# Patient Record
Sex: Female | Born: 1960 | Race: White | Hispanic: No | Marital: Single | Smoking: Never smoker
Health system: Southern US, Community
[De-identification: ages and names within clinical notes are randomized; demographics above are authoritative.]

## PROBLEM LIST (undated history)

## (undated) DIAGNOSIS — K219 Gastro-esophageal reflux disease without esophagitis: Secondary | ICD-10-CM

## (undated) DIAGNOSIS — I10 Essential (primary) hypertension: Secondary | ICD-10-CM

## (undated) DIAGNOSIS — R627 Adult failure to thrive: Secondary | ICD-10-CM

## (undated) DIAGNOSIS — M069 Rheumatoid arthritis, unspecified: Secondary | ICD-10-CM

## (undated) DIAGNOSIS — F79 Unspecified intellectual disabilities: Secondary | ICD-10-CM

## (undated) DIAGNOSIS — E7800 Pure hypercholesterolemia, unspecified: Secondary | ICD-10-CM

## (undated) DIAGNOSIS — E039 Hypothyroidism, unspecified: Secondary | ICD-10-CM

## (undated) DIAGNOSIS — R0902 Hypoxemia: Secondary | ICD-10-CM

## (undated) DIAGNOSIS — F329 Major depressive disorder, single episode, unspecified: Secondary | ICD-10-CM

## (undated) DIAGNOSIS — F32A Depression, unspecified: Secondary | ICD-10-CM

## (undated) DIAGNOSIS — E119 Type 2 diabetes mellitus without complications: Secondary | ICD-10-CM

## (undated) DIAGNOSIS — I509 Heart failure, unspecified: Secondary | ICD-10-CM

## (undated) DIAGNOSIS — E612 Magnesium deficiency: Secondary | ICD-10-CM

## (undated) HISTORY — PX: ABDOMINAL HYSTERECTOMY: SHX81

---

## 2002-03-22 ENCOUNTER — Emergency Department (HOSPITAL_COMMUNITY): Admission: EM | Admit: 2002-03-22 | Discharge: 2002-03-22 | Payer: Self-pay | Admitting: *Deleted

## 2004-03-16 ENCOUNTER — Ambulatory Visit (HOSPITAL_COMMUNITY): Admission: RE | Admit: 2004-03-16 | Discharge: 2004-03-16 | Payer: Self-pay | Admitting: Internal Medicine

## 2008-12-12 ENCOUNTER — Emergency Department (HOSPITAL_BASED_OUTPATIENT_CLINIC_OR_DEPARTMENT_OTHER): Admission: EM | Admit: 2008-12-12 | Discharge: 2008-12-12 | Payer: Self-pay | Admitting: Emergency Medicine

## 2008-12-12 ENCOUNTER — Ambulatory Visit: Payer: Self-pay | Admitting: Diagnostic Radiology

## 2010-02-01 ENCOUNTER — Ambulatory Visit: Payer: Self-pay | Admitting: Diagnostic Radiology

## 2010-02-01 ENCOUNTER — Emergency Department (HOSPITAL_BASED_OUTPATIENT_CLINIC_OR_DEPARTMENT_OTHER): Admission: EM | Admit: 2010-02-01 | Discharge: 2010-02-01 | Payer: Self-pay | Admitting: Emergency Medicine

## 2011-02-01 LAB — POCT CARDIAC MARKERS
CKMB, poc: 2.9 ng/mL (ref 1.0–8.0)
Myoglobin, poc: 101 ng/mL (ref 12–200)

## 2011-02-01 LAB — CBC
HCT: 42.3 % (ref 36.0–46.0)
MCV: 89.7 fL (ref 78.0–100.0)
RBC: 4.72 MIL/uL (ref 3.87–5.11)
RDW: 14.3 % (ref 11.5–15.5)
WBC: 9.3 10*3/uL (ref 4.0–10.5)

## 2011-02-01 LAB — CK: Total CK: 57 U/L (ref 7–177)

## 2011-02-01 LAB — COMPREHENSIVE METABOLIC PANEL
ALT: 32 U/L (ref 0–35)
Alkaline Phosphatase: 78 U/L (ref 39–117)
BUN: 42 mg/dL — ABNORMAL HIGH (ref 6–23)
Creatinine, Ser: 0.8 mg/dL (ref 0.4–1.2)
Glucose, Bld: 210 mg/dL — ABNORMAL HIGH (ref 70–99)
Potassium: 4.2 mEq/L (ref 3.5–5.1)
Total Protein: 6.8 g/dL (ref 6.0–8.3)

## 2011-02-01 LAB — DIFFERENTIAL
Basophils Absolute: 0 10*3/uL (ref 0.0–0.1)
Basophils Relative: 0 % (ref 0–1)
Eosinophils Relative: 0 % (ref 0–5)
Neutrophils Relative %: 87 % — ABNORMAL HIGH (ref 43–77)

## 2011-02-01 LAB — URINALYSIS, ROUTINE W REFLEX MICROSCOPIC
Glucose, UA: 500 mg/dL — AB
Hgb urine dipstick: NEGATIVE
Ketones, ur: NEGATIVE mg/dL
Nitrite: NEGATIVE
Specific Gravity, Urine: 1.018 (ref 1.005–1.030)

## 2013-06-17 DEATH — deceased

## 2013-10-29 ENCOUNTER — Emergency Department: Payer: Self-pay | Admitting: Emergency Medicine

## 2013-10-29 LAB — CBC WITH DIFFERENTIAL/PLATELET
BASOS PCT: 0.2 %
Basophil #: 0 10*3/uL (ref 0.0–0.1)
EOS ABS: 0 10*3/uL (ref 0.0–0.7)
Eosinophil %: 0.1 %
HCT: 46 % (ref 35.0–47.0)
HGB: 15.1 g/dL (ref 12.0–16.0)
LYMPHS ABS: 0.7 10*3/uL — AB (ref 1.0–3.6)
Lymphocyte %: 4.2 %
MCH: 30 pg (ref 26.0–34.0)
MCHC: 32.9 g/dL (ref 32.0–36.0)
MCV: 91 fL (ref 80–100)
MONO ABS: 0.4 x10 3/mm (ref 0.2–0.9)
MONOS PCT: 2.6 %
NEUTROS PCT: 92.9 %
Neutrophil #: 15.3 10*3/uL — ABNORMAL HIGH (ref 1.4–6.5)
PLATELETS: 251 10*3/uL (ref 150–440)
RBC: 5.04 10*6/uL (ref 3.80–5.20)
RDW: 15.3 % — AB (ref 11.5–14.5)
WBC: 16.5 10*3/uL — AB (ref 3.6–11.0)

## 2013-10-29 LAB — URINALYSIS, COMPLETE
BILIRUBIN, UR: NEGATIVE
BLOOD: NEGATIVE
GLUCOSE, UR: NEGATIVE mg/dL (ref 0–75)
LEUKOCYTE ESTERASE: NEGATIVE
NITRITE: NEGATIVE
Ph: 6 (ref 4.5–8.0)
Protein: NEGATIVE
RBC,UR: 1 /HPF (ref 0–5)
Specific Gravity: 1.018 (ref 1.003–1.030)

## 2013-10-29 LAB — LIPASE, BLOOD: LIPASE: 95 U/L (ref 73–393)

## 2013-10-29 LAB — COMPREHENSIVE METABOLIC PANEL
ALBUMIN: 3.9 g/dL (ref 3.4–5.0)
ALK PHOS: 77 U/L
AST: 113 U/L — AB (ref 15–37)
Anion Gap: 5 — ABNORMAL LOW (ref 7–16)
BUN: 19 mg/dL — AB (ref 7–18)
Bilirubin,Total: 0.6 mg/dL (ref 0.2–1.0)
Calcium, Total: 9.8 mg/dL (ref 8.5–10.1)
Chloride: 101 mmol/L (ref 98–107)
Co2: 30 mmol/L (ref 21–32)
Creatinine: 0.57 mg/dL — ABNORMAL LOW (ref 0.60–1.30)
EGFR (African American): >60
EGFR (Non-African Amer.): >60
GLUCOSE: 169 mg/dL — AB (ref 65–99)
Osmolality: 278 (ref 275–301)
POTASSIUM: 4 mmol/L (ref 3.5–5.1)
SGPT (ALT): 124 U/L — ABNORMAL HIGH (ref 12–78)
Sodium: 136 mmol/L (ref 136–145)
Total Protein: 7.9 g/dL (ref 6.4–8.2)

## 2013-10-29 LAB — TROPONIN I

## 2013-10-31 ENCOUNTER — Emergency Department: Payer: Self-pay | Admitting: Emergency Medicine

## 2013-10-31 LAB — URINALYSIS, COMPLETE
BILIRUBIN, UR: NEGATIVE
GLUCOSE, UR: NEGATIVE mg/dL (ref 0–75)
Nitrite: POSITIVE
PH: 5 (ref 4.5–8.0)
Protein: 30
SPECIFIC GRAVITY: 1.021 (ref 1.003–1.030)
Squamous Epithelial: 2
WBC UR: 41 /HPF (ref 0–5)

## 2013-10-31 LAB — BASIC METABOLIC PANEL
Anion Gap: 4 — ABNORMAL LOW (ref 7–16)
BUN: 19 mg/dL — ABNORMAL HIGH (ref 7–18)
CO2: 28 mmol/L (ref 21–32)
CREATININE: 0.61 mg/dL (ref 0.60–1.30)
Calcium, Total: 9.5 mg/dL (ref 8.5–10.1)
Chloride: 103 mmol/L (ref 98–107)
GLUCOSE: 141 mg/dL — AB (ref 65–99)
OSMOLALITY: 275 (ref 275–301)
Potassium: 3.6 mmol/L (ref 3.5–5.1)
SODIUM: 135 mmol/L — AB (ref 136–145)

## 2013-10-31 LAB — CBC
HCT: 43.9 % (ref 35.0–47.0)
HGB: 14.5 g/dL (ref 12.0–16.0)
MCH: 30.2 pg (ref 26.0–34.0)
MCHC: 33 g/dL (ref 32.0–36.0)
MCV: 92 fL (ref 80–100)
PLATELETS: 226 10*3/uL (ref 150–440)
RBC: 4.8 10*6/uL (ref 3.80–5.20)
RDW: 15.3 % — AB (ref 11.5–14.5)
WBC: 13.3 10*3/uL — ABNORMAL HIGH (ref 3.6–11.0)

## 2013-11-05 LAB — BASIC METABOLIC PANEL
Anion Gap: 11 (ref 7–16)
BUN: 19 mg/dL — AB (ref 7–18)
CO2: 29 mmol/L (ref 21–32)
CREATININE: 0.4 mg/dL — AB (ref 0.60–1.30)
Calcium, Total: 8.7 mg/dL (ref 8.5–10.1)
Chloride: 102 mmol/L (ref 98–107)
EGFR (Non-African Amer.): >60
Glucose: 94 mg/dL (ref 65–99)
Osmolality: 285 (ref 275–301)
POTASSIUM: 3.3 mmol/L — AB (ref 3.5–5.1)
SODIUM: 142 mmol/L (ref 136–145)

## 2013-11-05 LAB — URINALYSIS, COMPLETE
BILIRUBIN, UR: NEGATIVE
Bacteria: NONE SEEN
Glucose,UR: NEGATIVE mg/dL (ref 0–75)
LEUKOCYTE ESTERASE: NEGATIVE
Nitrite: NEGATIVE
PH: 5 (ref 4.5–8.0)
Protein: NEGATIVE
RBC,UR: 6 /HPF (ref 0–5)
Specific Gravity: 1.015 (ref 1.003–1.030)
WBC UR: 2 /HPF (ref 0–5)

## 2013-11-05 LAB — CBC WITH DIFFERENTIAL/PLATELET
BASOS ABS: 0.1 10*3/uL (ref 0.0–0.1)
BASOS PCT: 0.6 %
EOS ABS: 0.2 10*3/uL (ref 0.0–0.7)
Eosinophil %: 2.3 %
HCT: 47.6 % — ABNORMAL HIGH (ref 35.0–47.0)
HGB: 15.7 g/dL (ref 12.0–16.0)
Lymphocyte #: 1.3 10*3/uL (ref 1.0–3.6)
Lymphocyte %: 14.3 %
MCH: 30.5 pg (ref 26.0–34.0)
MCHC: 32.9 g/dL (ref 32.0–36.0)
MCV: 93 fL (ref 80–100)
MONOS PCT: 11.2 %
Monocyte #: 1 x10 3/mm — ABNORMAL HIGH (ref 0.2–0.9)
NEUTROS ABS: 6.5 10*3/uL (ref 1.4–6.5)
Neutrophil %: 71.6 %
Platelet: 255 10*3/uL (ref 150–440)
RBC: 5.14 10*6/uL (ref 3.80–5.20)
RDW: 15.5 % — AB (ref 11.5–14.5)
WBC: 9.1 10*3/uL (ref 3.6–11.0)

## 2013-11-05 LAB — TSH: THYROID STIMULATING HORM: 2.17 u[IU]/mL

## 2013-11-05 LAB — CK: CK, TOTAL: 65 U/L (ref 21–215)

## 2013-11-06 LAB — BASIC METABOLIC PANEL
ANION GAP: 6 — AB (ref 7–16)
BUN: 23 mg/dL — AB (ref 7–18)
CALCIUM: 9 mg/dL (ref 8.5–10.1)
Chloride: 104 mmol/L (ref 98–107)
Co2: 29 mmol/L (ref 21–32)
Creatinine: 0.46 mg/dL — ABNORMAL LOW (ref 0.60–1.30)
EGFR (African American): >60
EGFR (Non-African Amer.): >60
Glucose: 93 mg/dL (ref 65–99)
OSMOLALITY: 281 (ref 275–301)
Potassium: 3.5 mmol/L (ref 3.5–5.1)
Sodium: 139 mmol/L (ref 136–145)

## 2013-11-07 LAB — BASIC METABOLIC PANEL
ANION GAP: 5 — AB (ref 7–16)
BUN: 20 mg/dL — ABNORMAL HIGH (ref 7–18)
CHLORIDE: 109 mmol/L — AB (ref 98–107)
Calcium, Total: 8.7 mg/dL (ref 8.5–10.1)
Co2: 30 mmol/L (ref 21–32)
Creatinine: 0.57 mg/dL — ABNORMAL LOW (ref 0.60–1.30)
Glucose: 118 mg/dL — ABNORMAL HIGH (ref 65–99)
OSMOLALITY: 291 (ref 275–301)
Potassium: 4.1 mmol/L (ref 3.5–5.1)
SODIUM: 144 mmol/L (ref 136–145)

## 2013-11-08 ENCOUNTER — Inpatient Hospital Stay: Payer: Self-pay | Admitting: Internal Medicine

## 2013-11-08 LAB — URINALYSIS, COMPLETE
Bilirubin,UR: NEGATIVE
Blood: NEGATIVE
Glucose,UR: NEGATIVE mg/dL (ref 0–75)
Hyaline Cast: 1
Ketone: NEGATIVE
Leukocyte Esterase: NEGATIVE
Nitrite: NEGATIVE
Ph: 5 (ref 4.5–8.0)
Protein: NEGATIVE
RBC,UR: 1 /HPF (ref 0–5)
Specific Gravity: 1.017 (ref 1.003–1.030)
Squamous Epithelial: 3
WBC UR: 6 /HPF (ref 0–5)

## 2013-11-08 LAB — CBC WITH DIFFERENTIAL/PLATELET
Basophil #: 0 10*3/uL (ref 0.0–0.1)
Basophil %: 1.2 %
Eosinophil #: 0.3 10*3/uL (ref 0.0–0.7)
Eosinophil %: 9.6 %
HCT: 40.7 % (ref 35.0–47.0)
HGB: 13.1 g/dL (ref 12.0–16.0)
Lymphocyte #: 1.2 10*3/uL (ref 1.0–3.6)
Lymphocyte %: 33.7 %
MCH: 30.4 pg (ref 26.0–34.0)
MCHC: 32.3 g/dL (ref 32.0–36.0)
MCV: 94 fL (ref 80–100)
Monocyte #: 0.7 x10 3/mm (ref 0.2–0.9)
Monocyte %: 20.5 %
Neutrophil #: 1.2 10*3/uL — ABNORMAL LOW (ref 1.4–6.5)
Neutrophil %: 35 %
Platelet: 219 10*3/uL (ref 150–440)
RBC: 4.32 10*6/uL (ref 3.80–5.20)
RDW: 15.9 % — ABNORMAL HIGH (ref 11.5–14.5)
WBC: 3.5 10*3/uL — ABNORMAL LOW (ref 3.6–11.0)

## 2013-11-08 LAB — WBCS, STOOL

## 2013-11-08 LAB — BASIC METABOLIC PANEL
Anion Gap: 0 — ABNORMAL LOW (ref 7–16)
BUN: 16 mg/dL (ref 7–18)
CALCIUM: 8.7 mg/dL (ref 8.5–10.1)
CO2: 37 mmol/L — AB (ref 21–32)
CREATININE: 0.59 mg/dL — AB (ref 0.60–1.30)
Chloride: 105 mmol/L (ref 98–107)
EGFR (Non-African Amer.): >60
Glucose: 95 mg/dL (ref 65–99)
OSMOLALITY: 284 (ref 275–301)
Potassium: 4 mmol/L (ref 3.5–5.1)
SODIUM: 142 mmol/L (ref 136–145)

## 2013-11-10 LAB — CBC WITH DIFFERENTIAL/PLATELET
Basophil #: 0 10*3/uL (ref 0.0–0.1)
Basophil %: 1 %
EOS PCT: 6.3 %
Eosinophil #: 0.3 10*3/uL (ref 0.0–0.7)
HCT: 41.7 % (ref 35.0–47.0)
HGB: 13.6 g/dL (ref 12.0–16.0)
Lymphocyte #: 1.5 10*3/uL (ref 1.0–3.6)
Lymphocyte %: 34 %
MCH: 30.3 pg (ref 26.0–34.0)
MCHC: 32.5 g/dL (ref 32.0–36.0)
MCV: 93 fL (ref 80–100)
MONO ABS: 0.9 x10 3/mm (ref 0.2–0.9)
MONOS PCT: 19.8 %
Neutrophil #: 1.7 10*3/uL (ref 1.4–6.5)
Neutrophil %: 38.9 %
PLATELETS: 246 10*3/uL (ref 150–440)
RBC: 4.47 10*6/uL (ref 3.80–5.20)
RDW: 15.3 % — AB (ref 11.5–14.5)
WBC: 4.5 10*3/uL (ref 3.6–11.0)

## 2013-11-10 LAB — BASIC METABOLIC PANEL
ANION GAP: 1 — AB (ref 7–16)
BUN: 17 mg/dL (ref 7–18)
CHLORIDE: 96 mmol/L — AB (ref 98–107)
CO2: 37 mmol/L — AB (ref 21–32)
CREATININE: 0.61 mg/dL (ref 0.60–1.30)
Calcium, Total: 9 mg/dL (ref 8.5–10.1)
GLUCOSE: 112 mg/dL — AB (ref 65–99)
Osmolality: 271 (ref 275–301)
Potassium: 4.1 mmol/L (ref 3.5–5.1)
Sodium: 134 mmol/L — ABNORMAL LOW (ref 136–145)

## 2013-11-16 LAB — COMPREHENSIVE METABOLIC PANEL
Albumin: 3.4 g/dL (ref 3.4–5.0)
Alkaline Phosphatase: 109 U/L
Anion Gap: 6 — ABNORMAL LOW (ref 7–16)
BUN: 26 mg/dL — ABNORMAL HIGH (ref 7–18)
Bilirubin,Total: 0.8 mg/dL (ref 0.2–1.0)
Calcium, Total: 9.5 mg/dL (ref 8.5–10.1)
Chloride: 95 mmol/L — ABNORMAL LOW (ref 98–107)
Co2: 32 mmol/L (ref 21–32)
Creatinine: 0.55 mg/dL — ABNORMAL LOW (ref 0.60–1.30)
EGFR (African American): >60
EGFR (Non-African Amer.): >60
Glucose: 171 mg/dL — ABNORMAL HIGH (ref 65–99)
Osmolality: 275 (ref 275–301)
Potassium: 3.8 mmol/L (ref 3.5–5.1)
SGOT(AST): 37 U/L (ref 15–37)
SGPT (ALT): 43 U/L (ref 12–78)
Sodium: 133 mmol/L — ABNORMAL LOW (ref 136–145)
Total Protein: 7.5 g/dL (ref 6.4–8.2)

## 2013-11-16 LAB — CBC WITH DIFFERENTIAL/PLATELET
Basophil #: 0.1 10*3/uL (ref 0.0–0.1)
Basophil %: 0.7 %
Eosinophil #: 0 10*3/uL (ref 0.0–0.7)
Eosinophil %: 0.4 %
HCT: 48.9 % — ABNORMAL HIGH (ref 35.0–47.0)
HGB: 16 g/dL (ref 12.0–16.0)
Lymphocyte #: 1.3 10*3/uL (ref 1.0–3.6)
Lymphocyte %: 13.7 %
MCH: 30.5 pg (ref 26.0–34.0)
MCHC: 32.8 g/dL (ref 32.0–36.0)
MCV: 93 fL (ref 80–100)
Monocyte #: 0.7 x10 3/mm (ref 0.2–0.9)
Monocyte %: 7.2 %
Neutrophil #: 7.2 10*3/uL — ABNORMAL HIGH (ref 1.4–6.5)
Neutrophil %: 78 %
Platelet: 351 10*3/uL (ref 150–440)
RBC: 5.26 10*6/uL — ABNORMAL HIGH (ref 3.80–5.20)
RDW: 15.4 % — ABNORMAL HIGH (ref 11.5–14.5)
WBC: 9.2 10*3/uL (ref 3.6–11.0)

## 2013-11-16 LAB — LIPASE, BLOOD: LIPASE: 128 U/L (ref 73–393)

## 2013-11-16 LAB — AMYLASE: Amylase: 67 U/L (ref 25–115)

## 2013-11-17 LAB — BASIC METABOLIC PANEL
Anion Gap: 6 — ABNORMAL LOW (ref 7–16)
BUN: 34 mg/dL — ABNORMAL HIGH (ref 7–18)
CO2: 30 mmol/L (ref 21–32)
Calcium, Total: 9.3 mg/dL (ref 8.5–10.1)
Chloride: 99 mmol/L (ref 98–107)
Creatinine: 0.54 mg/dL — ABNORMAL LOW (ref 0.60–1.30)
EGFR (African American): >60
GLUCOSE: 151 mg/dL — AB (ref 65–99)
Osmolality: 281 (ref 275–301)
Potassium: 3.6 mmol/L (ref 3.5–5.1)
Sodium: 135 mmol/L — ABNORMAL LOW (ref 136–145)

## 2013-11-17 LAB — CBC WITH DIFFERENTIAL/PLATELET
Basophil #: 0.1 10*3/uL (ref 0.0–0.1)
Basophil %: 0.7 %
EOS PCT: 0.2 %
Eosinophil #: 0 10*3/uL (ref 0.0–0.7)
HCT: 45.5 % (ref 35.0–47.0)
HGB: 15.4 g/dL (ref 12.0–16.0)
LYMPHS PCT: 15 %
Lymphocyte #: 1.3 10*3/uL (ref 1.0–3.6)
MCH: 31 pg (ref 26.0–34.0)
MCHC: 33.8 g/dL (ref 32.0–36.0)
MCV: 92 fL (ref 80–100)
MONO ABS: 0.8 x10 3/mm (ref 0.2–0.9)
MONOS PCT: 10 %
Neutrophil #: 6.2 10*3/uL (ref 1.4–6.5)
Neutrophil %: 74.1 %
Platelet: 336 10*3/uL (ref 150–440)
RBC: 4.96 10*6/uL (ref 3.80–5.20)
RDW: 15.6 % — AB (ref 11.5–14.5)
WBC: 8.4 10*3/uL (ref 3.6–11.0)

## 2013-11-17 LAB — URINALYSIS, COMPLETE
BACTERIA: NONE SEEN
Bilirubin,UR: NEGATIVE
Blood: NEGATIVE
Glucose,UR: NEGATIVE mg/dL (ref 0–75)
Leukocyte Esterase: NEGATIVE
NITRITE: NEGATIVE
Ph: 5 (ref 4.5–8.0)
Protein: NEGATIVE
Specific Gravity: 1.03 (ref 1.003–1.030)
WBC UR: 4 /HPF (ref 0–5)

## 2013-11-18 LAB — BASIC METABOLIC PANEL
Anion Gap: 1 — ABNORMAL LOW (ref 7–16)
BUN: 25 mg/dL — ABNORMAL HIGH (ref 7–18)
Calcium, Total: 8.7 mg/dL (ref 8.5–10.1)
Chloride: 103 mmol/L (ref 98–107)
Co2: 33 mmol/L — ABNORMAL HIGH (ref 21–32)
Creatinine: 0.57 mg/dL — ABNORMAL LOW (ref 0.60–1.30)
EGFR (African American): >60
EGFR (Non-African Amer.): >60
Glucose: 67 mg/dL (ref 65–99)
Osmolality: 276 (ref 275–301)
Potassium: 3.7 mmol/L (ref 3.5–5.1)
Sodium: 137 mmol/L (ref 136–145)

## 2013-11-18 LAB — HEMATOCRIT: HCT: 41.5 % (ref 35.0–47.0)

## 2014-04-06 ENCOUNTER — Other Ambulatory Visit: Payer: Self-pay

## 2014-04-06 LAB — COMPREHENSIVE METABOLIC PANEL
ALBUMIN: 3.2 g/dL — AB (ref 3.4–5.0)
ALT: 34 U/L (ref 12–78)
ANION GAP: 6 — AB (ref 7–16)
Alkaline Phosphatase: 65 U/L
BILIRUBIN TOTAL: 0.5 mg/dL (ref 0.2–1.0)
BUN: 25 mg/dL — ABNORMAL HIGH (ref 7–18)
CALCIUM: 9.3 mg/dL (ref 8.5–10.1)
CO2: 33 mmol/L — AB (ref 21–32)
Chloride: 98 mmol/L (ref 98–107)
Creatinine: 0.56 mg/dL — ABNORMAL LOW (ref 0.60–1.30)
EGFR (African American): >60
GLUCOSE: 88 mg/dL (ref 65–99)
Osmolality: 278 (ref 275–301)
Potassium: 4.1 mmol/L (ref 3.5–5.1)
SGOT(AST): 37 U/L (ref 15–37)
Sodium: 137 mmol/L (ref 136–145)
Total Protein: 6.3 g/dL — ABNORMAL LOW (ref 6.4–8.2)

## 2014-04-06 LAB — CBC WITH DIFFERENTIAL/PLATELET
BASOS ABS: 0 10*3/uL (ref 0.0–0.1)
BASOS PCT: 0.6 %
Eosinophil #: 0.3 10*3/uL (ref 0.0–0.7)
Eosinophil %: 5.9 %
HCT: 42.8 % (ref 35.0–47.0)
HGB: 13.5 g/dL (ref 12.0–16.0)
LYMPHS PCT: 36.1 %
Lymphocyte #: 1.6 10*3/uL (ref 1.0–3.6)
MCH: 30.7 pg (ref 26.0–34.0)
MCHC: 31.6 g/dL — AB (ref 32.0–36.0)
MCV: 97 fL (ref 80–100)
MONOS PCT: 11.8 %
Monocyte #: 0.5 x10 3/mm (ref 0.2–0.9)
Neutrophil #: 2.1 10*3/uL (ref 1.4–6.5)
Neutrophil %: 45.6 %
Platelet: 202 10*3/uL (ref 150–440)
RBC: 4.4 10*6/uL (ref 3.80–5.20)
RDW: 15.5 % — ABNORMAL HIGH (ref 11.5–14.5)
WBC: 4.5 10*3/uL (ref 3.6–11.0)

## 2014-09-30 ENCOUNTER — Ambulatory Visit: Payer: Self-pay

## 2014-09-30 LAB — URINALYSIS, COMPLETE
BILIRUBIN, UR: NEGATIVE
Bacteria: NONE SEEN
Blood: NEGATIVE
GLUCOSE, UR: NEGATIVE mg/dL (ref 0–75)
Hyaline Cast: 2
KETONE: NEGATIVE
Leukocyte Esterase: NEGATIVE
NITRITE: NEGATIVE
Ph: 5 (ref 4.5–8.0)
Protein: NEGATIVE
SPECIFIC GRAVITY: 1.011 (ref 1.003–1.030)

## 2014-10-01 LAB — URINE CULTURE

## 2014-10-13 ENCOUNTER — Ambulatory Visit: Payer: Self-pay

## 2014-10-13 LAB — URINALYSIS, COMPLETE
Bilirubin,UR: NEGATIVE
Blood: NEGATIVE
GLUCOSE, UR: NEGATIVE mg/dL (ref 0–75)
Hyaline Cast: 5
KETONE: NEGATIVE
Nitrite: NEGATIVE
PROTEIN: NEGATIVE
Ph: 5 (ref 4.5–8.0)
RBC, UR: NONE SEEN /HPF (ref 0–5)
SQUAMOUS EPITHELIAL: NONE SEEN
Specific Gravity: 1.009 (ref 1.003–1.030)
WBC UR: 11 /HPF (ref 0–5)

## 2014-10-15 LAB — URINE CULTURE

## 2015-02-07 NOTE — H&P (Signed)
PATIENT NAME:  Meghan Patterson, Meghan Patterson MR#:  [id number] DATE OF BIRTH:  Mar 05, 1961  DATE OF ADMISSION:  11/05/2013  PRIMARY CARE PHYSICIAN: John B. Danne Harbor, MD  CHIEF COMPLAINT: Weakness and diarrhea.   HISTORY OF PRESENT ILLNESS: A 54 year old Caucasian female patient with history of cognitive impairment, polymyositis, who was recently seen here in the Emergency Room on 10/31/2013 after she suffered from falls and had weakness. Had multiple CTs and x-rays, ruled out for fractures or trauma and sent back to home. Returns as her sister, who helps take care of her, is unable to take care of her now and the patient was found covered in feces and urine in her bed, unable to get out. The patient is being admitted for placement in a nursing home. She does look dehydrated on examination. She is unable to tell me clearly how much diarrhea she has had but mentions that she was not taking much fluids, had diarrhea, complains of pain in her back since the fall.   No recent change in medications. I have discussed the case with the social worker in the Emergency Room and also the ER physician. Reviewed all old records.   PAST MEDICAL HISTORY:  1.  Cognitive impairment.  2.  Hypertension.  3.  Obesity.  4.  Arthritis.  5.  Diabetes.  6.  Polymyositis.   SOCIAL HISTORY: The patient normally ambulates with a walker. Lives with her sister, who helps take care of her and with the activities of daily living. Does not smoke. No alcohol. No illicit drugs.   CODE STATUS: Full code.   ALLERGIES: BEE STINGS.   FAMILY HISTORY: Reviewed but the patient is not aware of any medical problems in her mom and dad.   REVIEW OF SYSTEMS:  GENERAL:  No fever. Complains of fatigue and weakness.  EYES: No blurred vision, pain or redness.  ENT: No tinnitus, ear pain, hearing loss.  RESPIRATORY: No cough, wheeze, hemoptysis.  CARDIOVASCULAR: No chest pain, orthopnea, edema.  GASTROINTESTINAL: Has had nausea but no vomiting.  Had diarrhea, no abdominal pain. No bleeding.  GENITOURINARY: No dysuria, hematuria, frequency.  ENDOCRINE: No polyuria, nocturia. Has hypothyroidism.  HEMATOLOGIC AND LYMPHATIC: No anemia, easy bruising, bleeding.  INTEGUMENTARY: No acne, rash.  Has multiple areas of bruising on her body.  MUSCULOSKELETAL: Has back pain, arthritis.  NEUROLOGIC: No focal numbness, weakness, seizures.  PSYCHIATRIC: No anxiety or depression.   HOME MEDICATIONS: Include:  1.  Glyburide 10 mg oral 2 times a day.  2.  Metformin 1000 mg b.i.d.  3.  Losartan 100 mg daily.  4.  Methotrexate 2.5 mg 8 tablets orally once a week.  5.  Synthroid 175 mcg daily.  6.  Zetia 10 mg oral once a day.   PHYSICAL EXAMINATION: VITAL SIGNS: Temperature 97.9, pulse of 90, blood pressure 137/89, saturating 94% on room air. GENERAL:  Morbidly obese Caucasian female patient lying in bed, seems comfortable, conversational.  PSYCHIATRIC: Alert, awake, oriented to person, place and time. Has a flat affect.  HEENT: Atraumatic, normocephalic. Oral mucosa dry and pink. External ears and nose normal. No pallor. No icterus. Pupils bilaterally equal and reactive to light.  NECK: Supple. No thyromegaly or palpable lymph nodes. Trachea midline. No carotid bruit or JVD.  CARDIOVASCULAR: S1, S2, without any murmurs. Peripheral pulses 2+.  RESPIRATORY: Normal work of breathing. Clear to auscultation on both sides.  GASTROINTESTINAL: Soft abdomen, nontender. Bowel sounds present. No hepatosplenomegaly palpable.  SKIN: Warm and dry. Has bruising on  lower extremities and in her back. Has dry skin and freckles in her groin.  MUSCULOSKELETAL: No joint swelling, redness noticed. Tenderness in her back. Normal muscle tone.  NEUROLOGICAL: Motor strength 5/5 in upper extremities and about 2/5 in lower extremities. Babinski is downgoing. Sensation is intact all over.  LYMPHATIC: No cervical lymphadenopathy.   LABORATORY, DIAGNOSTIC AND RADIOLOGICAL  DATA:  Show:  1.  Glucose of 94, BUN 19, creatinine 0.4, sodium 142, potassium 3.3. GFR greater than 60. WBC 9.8, hemoglobin of 15, platelets 255, neutrophils 71%.  2.  Urinalysis shows no bacteria.  3.  Imaging studies from recent ER visit after the fall show a CT of the chest with no PE. No other acute abnormalities.  4.  CT lumbar spine without contrast showed superior endplate fracture of L2 which is remote, ankylosing across endplates at L2-3 and likely L1-2, fusion of posterior elements from T12 to L2 and also multilevel spondylosis.  5.  Left knee complete x-ray showed no fractures or dislocation.  6.  Ultrasound of lower extremity showed no DVT.    ASSESSMENT AND PLAN: 1.  Significant weakness in a patient with history of polymyositis, unable to take care of herself and family unable to have her home, here in the Emergency Room. Her weakness is contributed by dehydration but could also be from her polymyositis. We will start with IV hydration, admit the patient. She will likely need placement into long-term rehab. The patient also has had diarrhea contributing to her dehydration and weakness. We will check Clostridium difficile  and stool WBC. No abdominal pain or vomiting. No fever, normal white count. We will also check a CK level and TSH in this patient who has had progressively worsening weakness and immobility for the past few days, making her bed-bound. I discussed the case with the ER physician and social worker.  2.  Recent falls and back pain. CT of the lumbar spine showed no fractures. Does have significant degenerative changes.  3.  Hypokalemia, mild. Will replace p.o. and IV.  4.  Diabetes mellitus type 2. We will continue her metformin but reduce her glyburide to half as she has not been eating much and put her on a sliding scale insulin with Accu-Cheks before meals and at bedtime.  5.  Hypothyroidism. Check TSH.  6.  Deep vein thrombosis prophylaxis with Lovenox.  7.  CODE  STATUS: Full code.   TIME SPENT: Today on this case was 40 minutes.   ____________________________ Molinda Bailiff Faaris Arizpe, MD srs:cs D: 11/05/2013 15:36:55 ET T: 11/05/2013 15:48:29 ET JOB#: [id number]  cc: Wardell Heath R. Armistead Sult, MD, <Dictator> John B. Danne Harbor, MD Wardell Heath [place] Bali MD ELECTRONICALLY SIGNED 11/05/2013 19:30

## 2015-02-07 NOTE — Consult Note (Signed)
Chief Complaint:  Subjective/Chief Complaint Pt denies any nausea, vomiting or melena.  Denies abdominal pain.  She is requesting chicken broth & gingerale.   VITAL SIGNS/ANCILLARY NOTES: **Vital Signs.:   02-Feb-15 05:27  Vital Signs Type Routine  Temperature Temperature (F) 97.5  Celsius 36.3  Pulse Pulse 76  Respirations Respirations 18  Systolic BP Systolic BP 481  Diastolic BP (mmHg) Diastolic BP (mmHg) 73  Mean BP 85  Pulse Ox % Pulse Ox % 98  Pulse Ox Activity Level  At rest  Oxygen Delivery 1L   Brief Assessment:  GEN well developed, well nourished, no acute distress, Alert, oriented x2   Cardiac Regular   Respiratory normal resp effort   Gastrointestinal Normal   Gastrointestinal details normal Soft  Nontender  Nondistended  Bowel sounds normal  No rebound tenderness   EXTR Trace ankle edema bilat   Additional Physical Exam Skin: pink, warm, dry   Lab Results: Routine Chem:  02-Feb-15 07:43   Glucose, Serum 67  BUN  25  Creatinine (comp)  0.57  Sodium, Serum 137  Potassium, Serum 3.7  Chloride, Serum 103  CO2, Serum  33  Calcium (Total), Serum 8.7  Anion Gap  1  Osmolality (calc) 276  eGFR (African American) >60  eGFR (Non-African American) >60 (eGFR values <59m/min/1.73 m2 may be an indication of chronic kidney disease (CKD). Calculated eGFR is useful in patients with stable renal function. The eGFR calculation will not be reliable in acutely ill patients when serum creatinine is changing rapidly. It is not useful in  patients on dialysis. The eGFR calculation may not be applicable to patients at the low and high extremes of body sizes, pregnant women, and vegetarians.)  Routine Hem:  02-Feb-15 07:43   Hematocrit (CBC) 41.5 (Result(s) reported on 18 Nov 2013 at 08:50AM.)   Assessment/Plan:  Assessment/Plan:  Assessment Severe erosive esophagitis/gastritis:  On PPI Hematemesis secondary to #1: Resolved   Plan 1) Continue PPI  indefinitely 2) Advance diet as tolerated Please call if you have any questions or concerns   Electronic Signatures: JAndria Meuse(NP)  (Signed 02-Feb-15 10:28)  Authored: Chief Complaint, VITAL SIGNS/ANCILLARY NOTES, Brief Assessment, Lab Results, Assessment/Plan   Last Updated: 02-Feb-15 10:28 by JAndria Meuse(NP)

## 2015-02-07 NOTE — Consult Note (Signed)
Chief Complaint:  Subjective/Chief Complaint RN states no nausea or vomiting.  Ate 100% diet.   VITAL SIGNS/ANCILLARY NOTES: **Vital Signs.:   03-Feb-15 08:45  Vital Signs Type Pre Medication  Temperature Temperature (F) 98.9  Celsius 37.1  Temperature Source oral  Pulse Pulse 93  Respirations Respirations 18  Systolic BP Systolic BP 139  Diastolic BP (mmHg) Diastolic BP (mmHg) 84  Mean BP 102  Pulse Ox % Pulse Ox % 92  Pulse Ox Activity Level  At rest  Oxygen Delivery 2L; Nasal Cannula   Brief Assessment:  GEN well developed, well nourished, no acute distress, Alert, oriented x2   Cardiac Regular   Respiratory normal resp effort   Gastrointestinal Normal   Gastrointestinal details normal Soft  Nontender  Nondistended  Bowel sounds normal  No rebound tenderness   EXTR Trace ankle edema bilat   Additional Physical Exam Skin: pink, warm, dry   Assessment/Plan:  Assessment/Plan:  Assessment Severe erosive esophagitis/gastritis:  On PPI Hematemesis secondary to #1: Resolved   Plan Continue PPI indefinitely Will sign off, Please call if you have any questions or concerns   Electronic Signatures: Joselyn Arrow (NP)  (Signed 03-Feb-15 11:26)  Authored: Chief Complaint, VITAL SIGNS/ANCILLARY NOTES, Brief Assessment, Assessment/Plan   Last Updated: 03-Feb-15 11:26 by Joselyn Arrow (NP)

## 2015-02-07 NOTE — Consult Note (Signed)
PATIENT NAME:  Meghan Patterson, Meghan Patterson MR#:  [id number] DATE OF BIRTH:  [birth year]  DATE OF CONSULTATION:  11/17/2013  CONSULTING PHYSICIAN:  Midge Minium, MD CONSULTING SERVICE: Gastroenterology.   REASON FOR CONSULTATION: Hematemesis with nausea and vomiting.   HISTORY OF PRESENT ILLNESS: This patient is a 54 year old woman who has history of cognitive impairment who was admitted with weakness and diarrhea. The patient then had reported that she was having some nausea with the diarrhea improving. The patient reported that the nausea had improved and she started to have the nausea again within the last few days and had an episode of vomiting which was reported to be coffee ground emesis. The patient also reports that she has a poor appetite. There is no report of any black stools or bloody stools. The patient's labs have shown her hemoglobin to be stable at above 15. She did have an KUB of the abdomen that showed bibasilar atelectasis with nonobstructive bowel gas pattern, but no other intestinal abnormalities were seen. I am now being asked to see the patient for her hematemesis and nausea and vomiting with abdominal pain.   PAST MEDICAL HISTORY: Cognitive impairment, hypertension, obesity, arthritis, diabetes, polymyositis.   SOCIAL HISTORY: The patient lives with her sister. Does not smoke. No drugs or alcohol.   ALLERGIES: BEE STINGS.   FAMILY HISTORY: Noncontributory.   REVIEW OF SYSTEMS: A 12-point review of systems negative except what was stated above.   HOME MEDICATIONS: Glyburide, metformin, losartan, methotrexate, Synthroid, Zetia.   PHYSICAL EXAMINATION: GENERAL: The patient cognitively impaired, but able to answer questions with full sentences. In no apparent distress.  VITAL SIGNS: Temperature 98, pulse 116, respirations 18, blood pressure 146/94, pulse oximetry 95% on room air.  HEENT: Normocephalic, atraumatic. Extraocular motor intact. Pupils equally round and reactive to light and  accommodation without JVD, without lymphadenopathy.  LUNGS: Clear to auscultation bilaterally.  HEART: Regular rate and rhythm without murmurs, rubs or gallops.  ABDOMEN: Soft, nondistended, mild tenderness in the midepigastric area, which is made worse by flexing the abdominal wall muscles without rebound, without guarding.  EXTREMITIES: Without cyanosis, clubbing or edema.  NEUROLOGICAL: Grossly intact.  SKIN: Without any rashes or lesions.   ANCILLARY SERVICES: As stated above.   ASSESSMENT AND PLAN: This patient is a 54 year old woman who has cognitive impairment who has had some nausea with vomiting which was reported to be coffee ground. The patient's hemoglobin is normal and has been stable. The patient has no further diarrhea. The abdominal pain is most consistent with musculoskeletal pain, probably a muscle strain from her vomiting. Due to the hematemesis the patient will be set up for an upper endoscopy for today, since she is already n.p.o. Thank you very much for involving me in the care of this patient. If you have any questions, please do not hesitate to call.   ____________________________ Midge Minium, MD dw:sg D: 11/17/2013 11:27:39 ET T: 11/17/2013 14:12:01 ET JOB#: [id number]  cc: Midge Minium, MD, <Dictator> Midge Minium MD ELECTRONICALLY SIGNED 11/21/2013 6:21

## 2015-02-07 NOTE — Discharge Summary (Signed)
ADDENDUM  PATIENT NAME:  Meghan Patterson, Meghan Patterson MR#:  [id number] DATE OF BIRTH:  04/27/1961  DATE OF ADMISSION:  11/08/2013 DATE OF DISCHARGE:  11/20/2013  During the almost 7 days that the patient remained in the hospital waiting on her [employer] form to be approved by the stay, she was placed on citalopram for, what appeared to be, depression. She also had 1 episode of nausea and vomiting with coffee-ground emesis. She was seen by GI, who performed upper endoscopy that revealed rather severe reflux esophagitis. The patient was placed on PPI the form of Protonix 40 mg daily. Her other medications remain unchanged. ____________________________ Letta Pate Danne Harbor, MD jbw:aw D: 11/20/2013 08:11:23 ET T: 11/20/2013 08:17:34 ET JOB#: [id number]  cc: Jonny Ruiz B. Danne Harbor, MD, <Dictator> Elmo Putt III MD ELECTRONICALLY SIGNED 11/22/2013 15:20

## 2015-02-07 NOTE — Discharge Summary (Signed)
PATIENT NAME:  Meghan Patterson, BLAKLEY MR#:  [id number] DATE OF BIRTH:  1961-08-01  DATE OF ADMISSION:  11/08/2013 DATE OF DISCHARGE:  11/12/2013  HISTORY OF PRESENT ILLNESS: Ms. Ebrahim is a 54 year old white lady with a history of mental retardation and polymyositis, who had made several trips to the Emergency Room when her family became unable to handle her at home. The patient apparently had had several weeks of  nausea, vomiting and diarrhea and became so weak she could not get out of bed. On her third presentation to the Emergency Room, she was evaluated by the hospitalist and admitted for further therapy and possible placement.   PAST MEDICAL HISTORY: Notable for mental retardation, hypertension, obesity, osteoarthritis, type 2 diabetes and polymyositis.   ALLERGIES: No medication allergies.   MEDICATIONS ON ADMISSION: Included glyburide 10 mg 2 tablets a day, metformin 1000 mg b.i.d., losartan 100 mg daily, methotrexate 2.5 mg 8 tablets once a week, Synthroid 175 mcg daily and Zetia 10 mg once a day.   ADMISSION PHYSICAL EXAMINATION: Revealed a temperature 97.9, pulse 90 and a blood pressure 137/89. Pulse oximetry was 94% on room air. Admitting physical examination as described by the admitting physician was relatively unremarkable, although she was noted to be obese and weak. There was some suggestion of dehydration.   The patient's admission CBC showed a hemoglobin of 15.7 with a hematocrit of 47.6. White count was 9100. Platelet count was 255,000. Admission comprehensive metabolic panel was notable only for a potassium of 3.3. TSH was 2.17. Urinalysis showed 1+ ketones and 1+ blood. Microscopic; however, was relatively unremarkable. Stool for WBCs on admission was negative.   HOSPITAL COURSE: The patient was admitted to the regular medical floor where she was rehydrated with IV fluids and her electrolyte imbalance corrected. It was noted that after admission, she had no further nausea, vomiting  or diarrhea. She did have an episode of relative hypoxia and a chest x-ray showed bibasilar pneumonia. She was started on Rocephin and Levaquin at that time. Her oximetry improved and she became relatively asymptomatic, although her final chest x-ray had not shown much change. The patient was seen during her hospitalization by physical therapy, but due to her mental retardation, she was not able to cooperate fully, although she was making some progress by the time of transfer.   DISCHARGE DIAGNOSES:  1.  Female failure to thrive.  2.  Generalized weakness secondary to polymyositis.  3.  Mental retardation.  4.  Hypertension.  5.  Type 2 diabetes.   DISCHARGE MEDICATIONS:  1.  Tylenol 650 mg every 4 hours p.r.n. pain or fever.  2.  Aspirin 81 mg daily.  3.  Zetia 10 mg daily.  4.  Glyburide 5 mg b.i.d.  5.  Levoxyl 175 mcg daily.  6.  Losartan 100 mg daily.  7.  Metformin 1000 mg b.i.d.  8.  Methotrexate 20 mg once a week on Thursday.  9.  Levoxyl 250 mg daily for an additional 7 days.  10. Dulcolax suppository 10 mg q.12 hours p.r.n.  11. Colace 100 mg b.i.d.  12. Celexa 10 mg daily.   DISCHARGE DISPOSITION: The patient is being discharged on a low sodium, diabetic diet. She is to have activity as tolerated. She will continue to receive physical therapy. The patient is a full code. It is unclear at this point whether she will be a long-term stay or short-term rehab. ____________________________ Letta Pate. Danne Harbor, MD jbw:aw D: 11/12/2013 12:31:17 ET T: 11/12/2013 13:01:58  ET JOB#: [id number]  cc: Jonny Ruiz B. Danne Harbor, MD, <Dictator> Elmo Putt III MD ELECTRONICALLY SIGNED 11/22/2013 15:19

## 2015-02-07 NOTE — Consult Note (Signed)
Brief Consult Note: Diagnosis: Nausea and vomiting with coffee ground emesis.   Patient was seen by consultant.   Consult note dictated.   Comments: The patient has been NPO and will have an EGD today.  Electronic Signatures: Midge Minium (MD)  (Signed 01-Feb-15 10:57)  Authored: Brief Consult Note   Last Updated: 01-Feb-15 10:57 by Midge Minium (MD)

## 2015-04-29 LAB — HM DIABETES EYE EXAM

## 2015-07-17 ENCOUNTER — Emergency Department: Payer: Medicare Other

## 2015-07-17 ENCOUNTER — Inpatient Hospital Stay
Admission: EM | Admit: 2015-07-17 | Discharge: 2015-07-21 | DRG: 291 | Disposition: A | Discharge status: Skilled Nursing Facility | Payer: Medicare Other | Attending: Internal Medicine | Admitting: Internal Medicine

## 2015-07-17 ENCOUNTER — Inpatient Hospital Stay: Admit: 2015-07-17 | Payer: Medicare Other

## 2015-07-17 ENCOUNTER — Encounter: Payer: Self-pay | Admitting: *Deleted

## 2015-07-17 DIAGNOSIS — I110 Hypertensive heart disease with heart failure: Secondary | ICD-10-CM | POA: Diagnosis present

## 2015-07-17 DIAGNOSIS — Z91030 Bee allergy status: Secondary | ICD-10-CM

## 2015-07-17 DIAGNOSIS — F79 Unspecified intellectual disabilities: Secondary | ICD-10-CM | POA: Diagnosis present

## 2015-07-17 DIAGNOSIS — R0902 Hypoxemia: Secondary | ICD-10-CM

## 2015-07-17 DIAGNOSIS — E662 Morbid (severe) obesity with alveolar hypoventilation: Secondary | ICD-10-CM | POA: Diagnosis present

## 2015-07-17 DIAGNOSIS — J9602 Acute respiratory failure with hypercapnia: Secondary | ICD-10-CM | POA: Diagnosis present

## 2015-07-17 DIAGNOSIS — I959 Hypotension, unspecified: Secondary | ICD-10-CM | POA: Diagnosis not present

## 2015-07-17 DIAGNOSIS — B349 Viral infection, unspecified: Secondary | ICD-10-CM | POA: Diagnosis present

## 2015-07-17 DIAGNOSIS — M069 Rheumatoid arthritis, unspecified: Secondary | ICD-10-CM | POA: Diagnosis present

## 2015-07-17 DIAGNOSIS — I509 Heart failure, unspecified: Secondary | ICD-10-CM

## 2015-07-17 DIAGNOSIS — E039 Hypothyroidism, unspecified: Secondary | ICD-10-CM | POA: Diagnosis present

## 2015-07-17 DIAGNOSIS — K219 Gastro-esophageal reflux disease without esophagitis: Secondary | ICD-10-CM | POA: Diagnosis present

## 2015-07-17 DIAGNOSIS — Z79899 Other long term (current) drug therapy: Secondary | ICD-10-CM | POA: Diagnosis not present

## 2015-07-17 DIAGNOSIS — I5033 Acute on chronic diastolic (congestive) heart failure: Principal | ICD-10-CM | POA: Diagnosis present

## 2015-07-17 DIAGNOSIS — E119 Type 2 diabetes mellitus without complications: Secondary | ICD-10-CM | POA: Diagnosis present

## 2015-07-17 DIAGNOSIS — E785 Hyperlipidemia, unspecified: Secondary | ICD-10-CM | POA: Diagnosis present

## 2015-07-17 DIAGNOSIS — R0602 Shortness of breath: Secondary | ICD-10-CM

## 2015-07-17 DIAGNOSIS — Z7982 Long term (current) use of aspirin: Secondary | ICD-10-CM | POA: Diagnosis not present

## 2015-07-17 DIAGNOSIS — F329 Major depressive disorder, single episode, unspecified: Secondary | ICD-10-CM | POA: Diagnosis present

## 2015-07-17 DIAGNOSIS — I5023 Acute on chronic systolic (congestive) heart failure: Secondary | ICD-10-CM | POA: Diagnosis present

## 2015-07-17 DIAGNOSIS — E1151 Type 2 diabetes mellitus with diabetic peripheral angiopathy without gangrene: Secondary | ICD-10-CM

## 2015-07-17 DIAGNOSIS — G4730 Sleep apnea, unspecified: Secondary | ICD-10-CM | POA: Diagnosis present

## 2015-07-17 DIAGNOSIS — E7800 Pure hypercholesterolemia, unspecified: Secondary | ICD-10-CM | POA: Diagnosis present

## 2015-07-17 DIAGNOSIS — J9601 Acute respiratory failure with hypoxia: Secondary | ICD-10-CM | POA: Diagnosis present

## 2015-07-17 DIAGNOSIS — I10 Essential (primary) hypertension: Secondary | ICD-10-CM | POA: Diagnosis present

## 2015-07-17 HISTORY — DX: Hypoxemia: R09.02

## 2015-07-17 HISTORY — DX: Major depressive disorder, single episode, unspecified: F32.9

## 2015-07-17 HISTORY — DX: Gastro-esophageal reflux disease without esophagitis: K21.9

## 2015-07-17 HISTORY — DX: Heart failure, unspecified: I50.9

## 2015-07-17 HISTORY — DX: Unspecified intellectual disabilities: F79

## 2015-07-17 HISTORY — DX: Hypothyroidism, unspecified: E03.9

## 2015-07-17 HISTORY — DX: Essential (primary) hypertension: I10

## 2015-07-17 HISTORY — DX: Depression, unspecified: F32.A

## 2015-07-17 HISTORY — DX: Type 2 diabetes mellitus without complications: E11.9

## 2015-07-17 HISTORY — DX: Pure hypercholesterolemia, unspecified: E78.00

## 2015-07-17 HISTORY — DX: Adult failure to thrive: R62.7

## 2015-07-17 HISTORY — DX: Rheumatoid arthritis, unspecified: M06.9

## 2015-07-17 HISTORY — DX: Magnesium deficiency: E61.2

## 2015-07-17 LAB — HEMOGLOBIN A1C: Hgb A1c MFr Bld: 5.7 % (ref 4.0–6.0)

## 2015-07-17 LAB — URINALYSIS COMPLETE WITH MICROSCOPIC (ARMC ONLY)
BILIRUBIN URINE: NEGATIVE
Bacteria, UA: NONE SEEN
GLUCOSE, UA: NEGATIVE mg/dL
HGB URINE DIPSTICK: NEGATIVE
KETONES UR: NEGATIVE mg/dL
Leukocytes, UA: NEGATIVE
NITRITE: NEGATIVE
PH: 8 (ref 5.0–8.0)
Protein, ur: 30 mg/dL — AB
Specific Gravity, Urine: 1.015 (ref 1.005–1.030)

## 2015-07-17 LAB — CBC WITH DIFFERENTIAL/PLATELET
BASOS ABS: 0 10*3/uL (ref 0–0.1)
BASOS PCT: 0 %
EOS ABS: 0.1 10*3/uL (ref 0–0.7)
EOS PCT: 2 %
HCT: 36.8 % (ref 35.0–47.0)
Hemoglobin: 12 g/dL (ref 12.0–16.0)
LYMPHS PCT: 17 %
Lymphs Abs: 0.9 10*3/uL — ABNORMAL LOW (ref 1.0–3.6)
MCH: 29.9 pg (ref 26.0–34.0)
MCHC: 32.6 g/dL (ref 32.0–36.0)
MCV: 91.7 fL (ref 80.0–100.0)
MONO ABS: 0.4 10*3/uL (ref 0.2–0.9)
Monocytes Relative: 8 %
Neutro Abs: 3.9 10*3/uL (ref 1.4–6.5)
Neutrophils Relative %: 73 %
PLATELETS: 212 10*3/uL (ref 150–440)
RBC: 4.01 MIL/uL (ref 3.80–5.20)
RDW: 15 % — AB (ref 11.5–14.5)
WBC: 5.4 10*3/uL (ref 3.6–11.0)

## 2015-07-17 LAB — COMPREHENSIVE METABOLIC PANEL
ALBUMIN: 3.9 g/dL (ref 3.5–5.0)
ALT: 24 U/L (ref 14–54)
AST: 30 U/L (ref 15–41)
Alkaline Phosphatase: 71 U/L (ref 38–126)
Anion gap: 8 (ref 5–15)
BUN: 23 mg/dL — AB (ref 6–20)
CHLORIDE: 98 mmol/L — AB (ref 101–111)
CO2: 34 mmol/L — AB (ref 22–32)
CREATININE: 0.59 mg/dL (ref 0.44–1.00)
Calcium: 8.7 mg/dL — ABNORMAL LOW (ref 8.9–10.3)
GFR calc Af Amer: >60 mL/min (ref >60)
Glucose, Bld: 194 mg/dL — ABNORMAL HIGH (ref 65–99)
POTASSIUM: 4.2 mmol/L (ref 3.5–5.1)
SODIUM: 140 mmol/L (ref 135–145)
Total Bilirubin: 0.4 mg/dL (ref 0.3–1.2)
Total Protein: 7 g/dL (ref 6.5–8.1)

## 2015-07-17 LAB — GLUCOSE, CAPILLARY
Glucose-Capillary: 149 mg/dL — ABNORMAL HIGH (ref 65–99)
Glucose-Capillary: 138 mg/dL — ABNORMAL HIGH (ref 65–99)
Glucose-Capillary: 127 mg/dL — ABNORMAL HIGH (ref 65–99)

## 2015-07-17 LAB — TROPONIN I
Troponin I: <0.03 ng/mL (ref <0.031)
Troponin I: <0.03 ng/mL (ref <0.031)
Troponin I: <0.03 ng/mL (ref <0.031)

## 2015-07-17 LAB — BLOOD GAS, ARTERIAL
ACID-BASE EXCESS: 9.7 mmol/L — AB (ref 0.0–3.0)
Allens test (pass/fail): POSITIVE — AB
BICARBONATE: 37.6 meq/L — AB (ref 21.0–28.0)
FIO2: 0.4
O2 SAT: 94.6 %
PATIENT TEMPERATURE: 37
PCO2 ART: 65 mmHg — AB (ref 32.0–48.0)
PO2 ART: 76 mmHg — AB (ref 83.0–108.0)
pH, Arterial: 7.37 (ref 7.350–7.450)

## 2015-07-17 LAB — BRAIN NATRIURETIC PEPTIDE: B NATRIURETIC PEPTIDE 5: 59 pg/mL (ref 0.0–100.0)

## 2015-07-17 LAB — MRSA PCR SCREENING: MRSA BY PCR: NEGATIVE

## 2015-07-17 LAB — LACTIC ACID, PLASMA
LACTIC ACID, VENOUS: 1.4 mmol/L (ref 0.5–2.0)
Lactic Acid, Venous: 1.3 mmol/L (ref 0.5–2.0)

## 2015-07-17 MED ORDER — PANTOPRAZOLE SODIUM 40 MG PO TBEC
40.0000 mg | DELAYED_RELEASE_TABLET | Freq: Every day | ORAL | Status: DC
  Filled 2015-07-17 (×2): qty 1

## 2015-07-17 MED ORDER — INSULIN ASPART 100 UNIT/ML ~~LOC~~ SOLN: 0.0000 [IU] | Freq: Every day | SUBCUTANEOUS | Status: DC

## 2015-07-17 MED ORDER — METOPROLOL TARTRATE 25 MG PO TABS
25.0000 mg | ORAL_TABLET | Freq: Two times a day (BID) | ORAL | Status: DC
  Administered 2015-07-17 – 2015-07-21 (×7): 25 mg via ORAL
  Filled 2015-07-17 (×9): qty 1

## 2015-07-17 MED ORDER — FUROSEMIDE 10 MG/ML IJ SOLN
40.0000 mg | Freq: Once | INTRAMUSCULAR | Status: AC
  Administered 2015-07-17: 40 mg via INTRAVENOUS
  Filled 2015-07-17: qty 4

## 2015-07-17 MED ORDER — LEVOTHYROXINE SODIUM 75 MCG PO TABS
150.0000 ug | ORAL_TABLET | Freq: Every day | ORAL | Status: DC
  Administered 2015-07-18 – 2015-07-21 (×4): 150 ug via ORAL
  Filled 2015-07-17 (×6): qty 2

## 2015-07-17 MED ORDER — ENOXAPARIN SODIUM 40 MG/0.4ML ~~LOC~~ SOLN
40.0000 mg | Freq: Two times a day (BID) | SUBCUTANEOUS | Status: DC
  Administered 2015-07-17 – 2015-07-21 (×9): 40 mg via SUBCUTANEOUS
  Filled 2015-07-17 (×9): qty 0.4

## 2015-07-17 MED ORDER — ACETAMINOPHEN 650 MG RE SUPP: 650.0000 mg | Freq: Four times a day (QID) | RECTAL | Status: DC | PRN

## 2015-07-17 MED ORDER — ACETAMINOPHEN 325 MG PO TABS
650.0000 mg | ORAL_TABLET | Freq: Four times a day (QID) | ORAL | Status: DC | PRN
  Administered 2015-07-17: 650 mg via ORAL
  Filled 2015-07-17: qty 2

## 2015-07-17 MED ORDER — IPRATROPIUM-ALBUTEROL 0.5-2.5 (3) MG/3ML IN SOLN: 3.0000 mL | RESPIRATORY_TRACT | Status: DC | PRN

## 2015-07-17 MED ORDER — ASPIRIN 81 MG PO CHEW
81.0000 mg | CHEWABLE_TABLET | Freq: Every day | ORAL | Status: DC
  Administered 2015-07-18 – 2015-07-21 (×4): 81 mg via ORAL
  Filled 2015-07-17 (×5): qty 1

## 2015-07-17 MED ORDER — SODIUM CHLORIDE 0.9 % IJ SOLN
3.0000 mL | Freq: Two times a day (BID) | INTRAMUSCULAR | Status: DC
  Administered 2015-07-17 – 2015-07-21 (×9): 3 mL via INTRAVENOUS

## 2015-07-17 MED ORDER — LOSARTAN POTASSIUM 50 MG PO TABS
100.0000 mg | ORAL_TABLET | Freq: Every day | ORAL | Status: DC
  Administered 2015-07-18 – 2015-07-21 (×3): 100 mg via ORAL
  Filled 2015-07-17 (×5): qty 2

## 2015-07-17 MED ORDER — INSULIN ASPART 100 UNIT/ML ~~LOC~~ SOLN
0.0000 [IU] | Freq: Three times a day (TID) | SUBCUTANEOUS | Status: DC
  Administered 2015-07-17 – 2015-07-19 (×3): 1 [IU] via SUBCUTANEOUS
  Administered 2015-07-20: 2 [IU] via SUBCUTANEOUS
  Administered 2015-07-20 – 2015-07-21 (×3): 1 [IU] via SUBCUTANEOUS
  Filled 2015-07-17: qty 2
  Filled 2015-07-17 (×7): qty 1

## 2015-07-17 MED ORDER — FUROSEMIDE 10 MG/ML IJ SOLN
20.0000 mg | Freq: Two times a day (BID) | INTRAMUSCULAR | Status: DC
  Administered 2015-07-17 – 2015-07-20 (×6): 20 mg via INTRAVENOUS
  Filled 2015-07-17 (×6): qty 2

## 2015-07-17 MED ORDER — AMLODIPINE BESYLATE 5 MG PO TABS
2.5000 mg | ORAL_TABLET | Freq: Every day | ORAL | Status: DC
  Administered 2015-07-18 – 2015-07-21 (×3): 2.5 mg via ORAL
  Filled 2015-07-17 (×5): qty 1

## 2015-07-17 MED ORDER — ONDANSETRON HCL 4 MG PO TABS: 4.0000 mg | ORAL_TABLET | Freq: Four times a day (QID) | ORAL | Status: DC | PRN

## 2015-07-17 MED ORDER — METFORMIN HCL 500 MG PO TABS
1000.0000 mg | ORAL_TABLET | Freq: Two times a day (BID) | ORAL | Status: DC
  Administered 2015-07-17 – 2015-07-21 (×6): 1000 mg via ORAL
  Filled 2015-07-17 (×8): qty 2

## 2015-07-17 MED ORDER — ONDANSETRON HCL 4 MG/2ML IJ SOLN
4.0000 mg | Freq: Four times a day (QID) | INTRAMUSCULAR | Status: DC | PRN
  Administered 2015-07-17 – 2015-07-18 (×4): 4 mg via INTRAVENOUS
  Filled 2015-07-17 (×4): qty 2

## 2015-07-17 MED ORDER — CITALOPRAM HYDROBROMIDE 20 MG PO TABS
10.0000 mg | ORAL_TABLET | Freq: Every day | ORAL | Status: DC
  Administered 2015-07-18 – 2015-07-21 (×4): 10 mg via ORAL
  Filled 2015-07-17 (×5): qty 1

## 2015-07-17 NOTE — ED Notes (Signed)
Pt was helped to a bed pan to void.

## 2015-07-17 NOTE — ED Provider Notes (Signed)
[facility] Emergency Department Provider Note  ____________________________________________  Time seen: Approximately 2:21 AM  I have reviewed the triage vital signs and the nursing notes.   HISTORY  Chief Complaint Shortness of Breath  Limited by intellectual disability   HPI Gillian P Branan is a 54 y.o. female who presents to the ED via EMS from nursing home with a chief complaint of shortness of breath. Nursing staff reports patient has had progressive shortness of breath for the past 3 days associated with chest tightness. Patient is usually on 2 L nasal cannula. Tonight was found to have O2 sats of 79% per EMS; SNF reports sats of 69% on oxygen. Patient complains of chills and cough productive of white sputum associated with chest tightness.Denies abdominal pain, vomiting, diarrhea, headache, neck pain. States breathing mildly improved after DuoNeb given en route per EMS.   Past Medical History  Diagnosis Date  . Hypertension   . GERD (gastroesophageal reflux disease)   . Diabetes mellitus without complication   . FTT (failure to thrive) in adult   . Depressive disorder   . Hypothyroid   . Hypoxemia   . Hypercholesteremia   . Heart failure   . Magnesium deficiency   . Rheumatoid arthritis   . Intellectual disability     There are no active problems to display for this patient.   History reviewed. No pertinent past surgical history.  No current outpatient prescriptions on file.  Allergies Bee venom  No family history on file.  Social History Social History  Substance Use Topics  . Smoking status: Never Smoker   . Smokeless tobacco: None  . Alcohol Use: None  Nonsmoker  Review of Systems Constitutional: Positive for chills. Eyes: No visual changes. ENT: No sore throat. Cardiovascular: Positive for chest pain. Respiratory: Positive for shortness of breath. Gastrointestinal: No abdominal pain.  No nausea, no vomiting.  No  diarrhea.  No constipation. Genitourinary: Negative for dysuria. Musculoskeletal: Negative for back pain. Skin: Negative for rash. Neurological: Negative for headaches, focal weakness or numbness.  10-point ROS otherwise negative.  ____________________________________________   PHYSICAL EXAM:  VITAL SIGNS: ED Triage Vitals  Enc Vitals Group     BP 07/17/15 0207 137/99 mmHg     Pulse Rate 07/17/15 0207 110     Resp 07/17/15 0207 33     Temp 07/17/15 0207 99.7 F (37.6 C)     Temp Source 07/17/15 0207 Oral     SpO2 07/17/15 0207 94 %     Weight 07/17/15 0207 250 lb (113.399 kg)     Height 07/17/15 0207 5\' 2"  (1.575 m)     Head Cir --      Peak Flow --      Pain Score --      Pain Loc --      Pain Edu? --      Excl. in GC? --     Constitutional: Alert and oriented. Chronically ill appearing and in mild acute distress. Eyes: Conjunctivae are normal. PERRL. EOMI. Head: Atraumatic. Nose: No congestion/rhinnorhea. Mouth/Throat: Mucous membranes are moist.  Oropharynx non-erythematous. Neck: No stridor. No carotid bruits. Cardiovascular: Tachycardic, regular rhythm. Grossly normal heart sounds.  Good peripheral circulation. Respiratory: Increased respiratory effort.  No retractions. Lungs diminished bilaterally. Gastrointestinal: Soft and nontender. No distention. No abdominal bruits. No CVA tenderness. Musculoskeletal: No lower extremity tenderness. 2+ nonpitting edema.  No joint effusions. Neurologic:  Delayed speech and language. No gross focal neurologic deficits are appreciated.  Skin:  Skin is warm, dry and intact. No rash noted. Psychiatric: Mood and affect are normal. Speech and behavior are normal.  ____________________________________________   LABS (all labs ordered are listed, but only abnormal results are displayed)  Labs Reviewed  CBC WITH DIFFERENTIAL/PLATELET - Abnormal; Notable for the following:    RDW 15.0 (*)    Lymphs Abs 0.9 (*)    All other  components within normal limits  COMPREHENSIVE METABOLIC PANEL - Abnormal; Notable for the following:    Chloride 98 (*)    CO2 34 (*)    Glucose, Bld 194 (*)    BUN 23 (*)    Calcium 8.7 (*)    All other components within normal limits  CULTURE, BLOOD (ROUTINE X 2)  CULTURE, BLOOD (ROUTINE X 2)  URINE CULTURE  BRAIN NATRIURETIC PEPTIDE  TROPONIN I  LACTIC ACID, PLASMA  LACTIC ACID, PLASMA  URINALYSIS COMPLETEWITH MICROSCOPIC ([facility] ONLY)   ____________________________________________  EKG  ED ECG REPORT I, SUNG,JADE J, the attending physician, personally viewed and interpreted this ECG.   Date: 07/17/2015  EKG Time: 0209  Rate: 106  Rhythm: sinus tachycardia  Axis: Normal  Intervals:right bundle branch block  ST&T Change: Nonspecific  ____________________________________________  RADIOLOGY  Portable chest x-ray (viewed by me, interpreted per Dr. Manus Gunning): Hypoventilatory chest. There is vascular congestion and mild peribronchial cuffing, may reflect very early pulmonary edema. Suspect CHF. Technically limited exam. ____________________________________________   PROCEDURES  Procedure(s) performed: None  Critical Care performed: Yes, see critical care note(s)   CRITICAL CARE Performed by: Irean Hong   Total critical care time: 30 minutes  Critical care time was exclusive of separately billable procedures and treating other patients.  Critical care was necessary to treat or prevent imminent or life-threatening deterioration.  Critical care was time spent personally by me on the following activities: development of treatment plan with patient and/or surrogate as well as nursing, discussions with consultants, evaluation of patient's response to treatment, examination of patient, obtaining history from patient or surrogate, ordering and performing treatments and interventions, ordering and review of laboratory studies, ordering and review of radiographic  studies, pulse oximetry and re-evaluation of patient's condition.  ____________________________________________   INITIAL IMPRESSION / ASSESSMENT AND PLAN / ED COURSE  Pertinent labs & imaging results that were available during my care of the patient were reviewed by me and considered in my medical decision making (see chart for details).  54 year old female who presents from nursing home with progressive shortness of breath 3 days associated with hypoxia. Will obtain blood cultures, screening labs, EKG, chest x-ray and reassess. Anticipate need for hospitalization.  ----------------------------------------- 4:06 AM on 07/17/2015 -----------------------------------------  Appears more comfortable. Room air saturations 91% on 4 L oxygen. Patient remains tachycardic. Urine appears cloudy and will be sent to lab for analysis. Family at bedside. Discussed with hospitalist to evaluate patient in the emergency department for hospital admission. ____________________________________________   FINAL CLINICAL IMPRESSION(S) / ED DIAGNOSES  Final diagnoses:  Hypoxia  Shortness of breath  Acute congestive heart failure, unspecified congestive heart failure type      Irean Hong, MD 07/17/15 [phone]

## 2015-07-17 NOTE — Progress Notes (Signed)
Initial Nutrition Assessment   INTERVENTION:   Meals and Snacks: Cater to patient preferences, will send oatmeal on breakfast trays per request   NUTRITION DIAGNOSIS:   Inadequate oral intake related to poor appetite as evidenced by meal completion < 25%.  GOAL:   Patient will meet greater than or equal to 90% of their needs  MONITOR:    (Energy Intake, Anthropometrics, Digestive system)  REASON FOR ASSESSMENT:   Diagnosis    ASSESSMENT:   Pt admitted from facility with SOB secondary to CHF. Pt with h/o mental disability.  Past Medical History  Diagnosis Date  . Hypertension   . GERD (gastroesophageal reflux disease)   . Diabetes mellitus without complication   . FTT (failure to thrive) in adult   . Depressive disorder   . Hypothyroid   . Hypoxemia   . Hypercholesteremia   . Heart failure   . Magnesium deficiency   . Rheumatoid arthritis   . Intellectual disability      Diet Order:  Diet heart healthy/carb modified Room service appropriate?: Yes; Fluid consistency:: Thin    Current Nutrition: Pt reports nausea this am and did not want to eat eggs/french toast this am.  Food/Nutrition-Related History: Pt reports eating well yesterday with a good appetite but does not feel well this am.    Medications: Protonix, Metformin, Novolog  Electrolyte/Renal Profile and Glucose Profile:   Recent Labs Lab 07/17/15 0248  NA 140  K 4.2  CL 98*  CO2 34*  BUN 23*  CREATININE 0.59  CALCIUM 8.7*  GLUCOSE 194*   Protein Profile:  Recent Labs Lab 07/17/15 0248  ALBUMIN 3.9    Gastrointestinal Profile: Last BM:  unknown   Nutrition-Focused Physical Exam Findings:  Unable to complete Nutrition-Focused physical exam at this time.    Weight Change: Pt reports weight of 255lbs,.  Current measured weight of 259lbs.   Skin:  Reviewed, no issues  Height:   Ht Readings from Last 1 Encounters:  07/17/15 5\' 2"  (1.575 m)    Weight:   Wt Readings from  Last 1 Encounters:  07/17/15 259 lb 12.8 oz (117.845 kg)    Ideal Body Weight:   50kg  BMI:  Body mass index is 47.51 kg/(m^2).  Estimated Nutritional Needs:   Kcal:  BEE: 1034kcals, TEE: (IF 1.1-1.3)(AF 1.3) 1506-1780kcals, using IBW of 50kg  Protein:  40-50g/kg (0.8-1.0g/kg) using IBW of 50kg  Fluid:  1250-1575mL of fluid (25-59mL/kg) using IBW of 50kg  EDUCATION NEEDS:   Education needs no appropriate at this time   MODERATE Care Level  31m, RD, LDN Pager [phone]

## 2015-07-17 NOTE — ED Notes (Signed)
Director of [employer] calling for information about pt, Advertising account planner that pt wasn't admitted or in hospital at all. Pt looked up to advise Director that pt was in fact admitted. No other information shared with Nursing Facility

## 2015-07-17 NOTE — ED Notes (Signed)
Pt here from [facility] where she has been a resident since January 2016.  Pt has had SOB which has been increasing for the past 3 days.  Pt has also had some increasing CP (she states that the sob came first)  Pt has some intellectual challenges and was unable to give more info.  Pt is on  at home and  Was 79% on 2L for ems initially and 95% on 6L on arrival.  Pt had duoneb from ems on arrival.  No distress.  SNF reports spo2 69%

## 2015-07-17 NOTE — Progress Notes (Signed)
Patient refused PO morning meds due to nausea.  Sts she may take them later.  RN notified.

## 2015-07-17 NOTE — H&P (Signed)
[facility] -  at [facility]   PATIENT NAME: Meghan Patterson    MR#:  [id number]  DATE OF BIRTH:  September 30, 1961  DATE OF ADMISSION:  07/17/2015  PRIMARY CARE PHYSICIAN: Manus Rudd LEE, DO   REQUESTING/REFERRING PHYSICIAN: Dolores Frame, MD  CHIEF COMPLAINT:   Chief Complaint  Patient presents with  . Shortness of Breath    HISTORY OF PRESENT ILLNESS:  Meghan Patterson  is a 54 y.o. female who presents with acute worsening of progressive shortness of breath.  She has had increasing O2 requirement at the nursing facility where she resides over the past several weeks. This was initially just at night, however recently her sister and legal guardian was informed by nursing facility staff that she was given any oxygen continuously due to her shortness of breath. Patient has mental disability and is unable to contribute to her own history of present illness. Her sister states that the patient has been complaining of "not feeling well" for several weeks now. On evaluation in the ED lab workup was largely unremarkable, but chest x-ray showed early signs of congestive heart failure. Her sister states that she does not have a prior diagnosis of heart failure, though on chart review it is listed under her past medical history and she is on Lasix. Her sister states the Lasix was initiated for lower extremity edema. Hospitalists were called for admission for acute heart failure.  PAST MEDICAL HISTORY:   Past Medical History  Diagnosis Date  . Hypertension   . GERD (gastroesophageal reflux disease)   . Diabetes mellitus without complication   . FTT (failure to thrive) in adult   . Depressive disorder   . Hypothyroid   . Hypoxemia   . Hypercholesteremia   . Heart failure   . Magnesium deficiency   . Rheumatoid arthritis   . Intellectual disability     PAST SURGICAL HISTORY:   Past Surgical History  Procedure Laterality Date  . Abdominal hysterectomy      SOCIAL  HISTORY:   Social History  Substance Use Topics  . Smoking status: Never Smoker   . Smokeless tobacco: Not on file  . Alcohol Use: No    FAMILY HISTORY:   Family History  Problem Relation Age of Onset  . Diabetes Mother   . Hypertension Father   . Emphysema Father     DRUG ALLERGIES:   Allergies  Allergen Reactions  . Bee Venom     MEDICATIONS AT HOME:   Prior to Admission medications   Medication Sig Start Date End Date Taking? Authorizing Provider  amLODipine (NORVASC) 2.5 MG tablet Take 2.5 mg by mouth daily.   Yes Historical Provider, MD  aspirin 81 MG tablet Take 81 mg by mouth daily.   Yes Historical Provider, MD  citalopram (CELEXA) 10 MG tablet Take 10 mg by mouth daily.   Yes Historical Provider, MD  fentaNYL (DURAGESIC - DOSED MCG/HR) 12 MCG/HR Place 12.5 mcg onto the skin every 3 (three) days.   Yes Historical Provider, MD  levothyroxine (SYNTHROID, LEVOTHROID) 150 MCG tablet Take 150 mcg by mouth daily before breakfast.   Yes Historical Provider, MD  losartan (COZAAR) 100 MG tablet Take 100 mg by mouth daily.   Yes Historical Provider, MD  metFORMIN (GLUCOPHAGE) 1000 MG tablet Take 1,000 mg by mouth 2 (two) times daily with a meal.   Yes Historical Provider, MD  methotrexate (RHEUMATREX) 2.5 MG tablet Take 20 mg by mouth once a week. Caution:Chemotherapy. Protect from  light.   Yes Historical Provider, MD  pantoprazole (PROTONIX) 40 MG tablet Take 40 mg by mouth daily.   Yes Historical Provider, MD    REVIEW OF SYSTEMS:  Review of Systems  Constitutional: Negative for fever, chills, weight loss and malaise/fatigue.  HENT: Negative for ear pain, hearing loss and tinnitus.   Eyes: Negative for blurred vision, double vision, pain and redness.  Respiratory: Positive for shortness of breath. Negative for cough and hemoptysis.   Cardiovascular: Positive for leg swelling. Negative for chest pain, palpitations and orthopnea.  Gastrointestinal: Negative for nausea,  vomiting, abdominal pain, diarrhea and constipation.  Genitourinary: Negative for dysuria, frequency and hematuria.  Musculoskeletal: Negative for back pain, joint pain and neck pain.  Skin:       No acne, rash, or lesions  Neurological: Negative for dizziness, tremors, focal weakness and weakness.  Endo/Heme/Allergies: Negative for polydipsia. Does not bruise/bleed easily.  Psychiatric/Behavioral: Negative for depression. The patient is not nervous/anxious and does not have insomnia.     Review of systems is limited to the patient's baseline mental disability VITAL SIGNS:   Filed Vitals:   07/17/15 0207 07/17/15 0210 07/17/15 0230 07/17/15 0300  BP: 137/99  135/77 131/64  Pulse: 110  109 116  Temp: 99.7 F (37.6 C)     TempSrc: Oral     Resp: 33  20 21  Height: 5\' 2"  (1.575 m)     Weight: 113.399 kg (250 lb) 117.845 kg (259 lb 12.8 oz)    SpO2: 94%  91% 91%   Wt Readings from Last 3 Encounters:  07/17/15 117.845 kg (259 lb 12.8 oz)    PHYSICAL EXAMINATION:  Physical Exam  Vitals reviewed. Constitutional: She is oriented to person, place, and time. She appears well-developed and well-nourished. No distress.  HENT:  Head: Normocephalic and atraumatic.  Mouth/Throat: Oropharynx is clear and moist.  Eyes: Conjunctivae and EOM are normal. Pupils are equal, round, and reactive to light. No scleral icterus.  Neck: Normal range of motion. Neck supple. No JVD present. No thyromegaly present.  Cardiovascular: Normal rate, regular rhythm and intact distal pulses.  Exam reveals no gallop and no friction rub.   No murmur heard. Respiratory: Effort normal. No respiratory distress. She has no wheezes. She has rales (fine bibasilar).  GI: Soft. Bowel sounds are normal. She exhibits no distension. There is no tenderness.  Musculoskeletal: Normal range of motion. She exhibits edema (1+ bilateral lower extremity).  No arthritis, no gout  Lymphadenopathy:    She has no cervical adenopathy.   Neurological: She is alert and oriented to person, place, and time. No cranial nerve deficit.  No dysarthria, no aphasia  Skin: Skin is warm and dry. No rash noted. No erythema.  Psychiatric: She has a normal mood and affect. Her behavior is normal. Judgment and thought content normal.    LABORATORY PANEL:   CBC  Recent Labs Lab 07/17/15 0248  WBC 5.4  HGB 12.0  HCT 36.8  PLT 212   ------------------------------------------------------------------------------------------------------------------  Chemistries   Recent Labs Lab 07/17/15 0248  NA 140  K 4.2  CL 98*  CO2 34*  GLUCOSE 194*  BUN 23*  CREATININE 0.59  CALCIUM 8.7*  AST 30  ALT 24  ALKPHOS 71  BILITOT 0.4   ------------------------------------------------------------------------------------------------------------------  Cardiac Enzymes  Recent Labs Lab 07/17/15 0248  TROPONINI <0.03   ------------------------------------------------------------------------------------------------------------------  RADIOLOGY:  Dg Chest Port 1 View  07/17/2015   CLINICAL DATA:  Progressive shortness of breath for 3  days.  EXAM: PORTABLE CHEST 1 VIEW  COMPARISON:  Frontal and lateral views 11/10/2013  FINDINGS: Low lung volumes with elevation of right hemidiaphragm. Cardiomediastinal contours are grossly unchanged. There is vascular congestion and peribronchial cuffing. Atelectasis noted in the right mid lung zone. No large pleural effusion or evident pneumothorax. Detailed evaluation limited by low lung volumes and soft tissue attenuation from body habitus.  IMPRESSION: Hypoventilatory chest. There is vascular congestion and mild peribronchial cuffing, may reflect very early pulmonary edema. Suspect CHF. Technically limited exam.   Electronically Signed   By: Rubye Oaks M.D.   On: 07/17/2015 02:50    EKG:   Orders placed or performed during the hospital encounter of 07/17/15  . EKG 12-Lead  . EKG 12-Lead     IMPRESSION AND PLAN:  Principal Problem:   Acute on chronic systolic CHF (congestive heart failure) - unclear if she doesn't does not have a history of chronic systolic CHF, though on chart review it is listed in her medical history and she is on Lasix in outpatient setting. Chest x-ray now is suggestive of acute heart failure, despite a normal BNP. Medical symptoms also consistent with heart failure. She was given IV Lasix in the ED, we will order echocardiogram. Consider cardiology consult pending results of echocardiogram. Active Problems:   HTN (hypertension) - controlled here, continue home medications   Type 2 diabetes mellitus - on metformin plus sliding scale protocol with appropriate fingerstick glucose checks and a carb modified diet   GERD (gastroesophageal reflux disease) - equivalent home dose PPI   Hypothyroid - home dose thyroid replacement    HLD (hyperlipidemia) - home anti-lipids   All the records are reviewed and case discussed with ED provider. Management plans discussed with the patient and/or family.  DVT PROPHYLAXIS: SubQ lovenox  ADMISSION STATUS: Observation  CODE STATUS: Full  TOTAL TIME TAKING CARE OF THIS PATIENT: 40 minutes.    WILLIS, DAVID FIELDING 07/17/2015, 4:31 AM  [facility]  Office  [phone]  CC: Primary care physician; Manus Rudd LEE, DO

## 2015-07-17 NOTE — Care Management (Signed)
Spoke with patient Meghan Patterson who was at the bedside. MS Ray stated that she would prefer the patient not return to [facility] upon discharge. She expressed concerns over care there. She stated that she was planning to move to the [facility] area in the near future and would like the patient to be close to her. She added that if the patient had to return to [facility] temporarily that would be OK. She asked to speak with the attending physician and I did contact Dr Nevin Bloodgood who spoke with MS Ray. I explained that  I would speak with CSW regarding discharge plan.  Plan is to return to nursing home at discharge. Referral has already been placed by MD for CSW consult.

## 2015-07-17 NOTE — Progress Notes (Signed)
[facility] - [place] at [facility]   PATIENT NAME: Meghan Patterson    MR#:  [id number]  DATE OF BIRTH:  November 06, 1960  SUBJECTIVE:  CHIEF COMPLAINT:   Chief Complaint  Patient presents with  . Shortness of Breath   Patient here due to shortness of breath and noted to be in acute respiratory failure with hypoxia and hypercapnia.  No cough, congestion presently.  Family at bedside.    REVIEW OF SYSTEMS:    Review of Systems  Constitutional: Negative for fever and chills.  HENT: Negative for congestion and tinnitus.   Eyes: Negative for blurred vision and double vision.  Respiratory: Positive for shortness of breath. Negative for cough and wheezing.   Cardiovascular: Negative for chest pain, orthopnea and PND.  Gastrointestinal: Negative for nausea, vomiting, abdominal pain and diarrhea.  Genitourinary: Negative for dysuria and hematuria.  Neurological: Positive for weakness (generalized). Negative for dizziness, sensory change and focal weakness.  All other systems reviewed and are negative.   Nutrition: Heart healthy Tolerating Diet: Yes   DRUG ALLERGIES:   Allergies  Allergen Reactions  . Bee Venom     VITALS:  Blood pressure 122/64, pulse 96, temperature 97.6 F (36.4 C), temperature source Oral, resp. rate 18, height 5\' 2"  (1.575 m), weight 117.845 kg (259 lb 12.8 oz), SpO2 96 %.  PHYSICAL EXAMINATION:   Physical Exam  GENERAL:  54 y.o.-year-old obese patient lying in the bed with no acute distress.  EYES: Pupils equal, round, reactive to light and accommodation. No scleral icterus. Extraocular muscles intact.  HEENT: Head atraumatic, normocephalic. Oropharynx and nasopharynx clear.  NECK:  Supple, no jugular venous distention. No thyroid enlargement, no tenderness.  LUNGS: Normal breath sounds bilaterally, no wheezing, bibasilar rales, No rhonchi. No use of accessory muscles of respiration.  CARDIOVASCULAR: S1, S2 normal. No murmurs, rubs,  or gallops.  ABDOMEN: Soft, nontender, nondistended. Bowel sounds present. No organomegaly or mass.  EXTREMITIES: No cyanosis, clubbing or edema b/l.    NEUROLOGIC: Cranial nerves II through XII are intact. No focal Motor or sensory deficits b/l.  Globally weak PSYCHIATRIC: The patient is alert and oriented x 3. Good affect SKIN: No obvious rash, lesion, or ulcer.    LABORATORY PANEL:   CBC  Recent Labs Lab 07/17/15 0248  WBC 5.4  HGB 12.0  HCT 36.8  PLT 212   ------------------------------------------------------------------------------------------------------------------  Chemistries   Recent Labs Lab 07/17/15 0248  NA 140  K 4.2  CL 98*  CO2 34*  GLUCOSE 194*  BUN 23*  CREATININE 0.59  CALCIUM 8.7*  AST 30  ALT 24  ALKPHOS 71  BILITOT 0.4   ------------------------------------------------------------------------------------------------------------------  Cardiac Enzymes  Recent Labs Lab 07/17/15 1137  TROPONINI <0.03   ------------------------------------------------------------------------------------------------------------------  RADIOLOGY:  Dg Chest Port 1 View  07/17/2015   CLINICAL DATA:  Progressive shortness of breath for 3 days.  EXAM: PORTABLE CHEST 1 VIEW  COMPARISON:  Frontal and lateral views 11/10/2013  FINDINGS: Low lung volumes with elevation of right hemidiaphragm. Cardiomediastinal contours are grossly unchanged. There is vascular congestion and peribronchial cuffing. Atelectasis noted in the right mid lung zone. No large pleural effusion or evident pneumothorax. Detailed evaluation limited by low lung volumes and soft tissue attenuation from body habitus.  IMPRESSION: Hypoventilatory chest. There is vascular congestion and mild peribronchial cuffing, may reflect very early pulmonary edema. Suspect CHF. Technically limited exam.   Electronically Signed   By: 11/12/2013 M.D.   On: 07/17/2015 02:50  ASSESSMENT AND PLAN:    54 year old female with past medical history of hypertension, type 2 diabetes without compensation, hypothyroidism, hyperlipidemia, history of CHF, GERD, rheumatoid arthritis who presented to the hospital due to shortness of breath and noted to be in mild CHF.  #1 acute respiratory failure with hypoxia and hypercapnia-this is likely multifactorial and related to obesity pickwickian syndrome combined with mild CHF. -I will diurese the patient with IV Lasix, follow I's and O's and daily weights. -Continue CPAP at bedtime. Follow clinically. Patient likely will need to be in oxygen prior to being discharged.  #2 CHF-acute on chronic diastolic dysfunction likely secondary to sleep apnea. -Diurese with IV Lasix, follow I's and O's and daily weights. -Continue Toprol, losartan. Await two-dimensional echo results.  #3 type 2 diabetes without complication-continue metformin, sliding scale insulin.  #4 hypothyroidism-continue Synthroid.  #5 hypertension-continue Norvasc, metoprolol, losartan.  #6 depression-continue Celexa.  #7 GERD-continue Protonix.   All the records are reviewed and case discussed with Care Management/Social Workerr. Management plans discussed with the patient, family and they are in agreement.  CODE STATUS: Full  DVT Prophylaxis: Lovenox  TOTAL TIME TAKING CARE OF THIS PATIENT: 30 minutes.   POSSIBLE D/C IN 1-2 DAYS, DEPENDING ON CLINICAL CONDITION.   Houston Siren M.D on 07/17/2015 at 2:20 PM  Between 7am to 6pm - Pager - [phone]  After 6pm go to [email or web address] - password [email or web address] [facility]  [place] [facility]  Office  [phone]  CC: Primary care physician; Manus Rudd LEE, DO

## 2015-07-17 NOTE — ED Notes (Signed)
Pt [place] 6L on arrival, decreased to 4L and pt spo2 dropped to 87% on 4L [place], increased to 4L .  Family is at the bedside

## 2015-07-18 ENCOUNTER — Inpatient Hospital Stay (HOSPITAL_COMMUNITY)
Admit: 2015-07-18 | Discharge: 2015-07-18 | Disposition: A | Discharge status: Home / Self Care | Payer: Medicare Other | Attending: Internal Medicine | Admitting: Internal Medicine

## 2015-07-18 DIAGNOSIS — I5033 Acute on chronic diastolic (congestive) heart failure: Secondary | ICD-10-CM

## 2015-07-18 LAB — BASIC METABOLIC PANEL
ANION GAP: 8 (ref 5–15)
BUN: 19 mg/dL (ref 6–20)
CALCIUM: 8.8 mg/dL — AB (ref 8.9–10.3)
CO2: 36 mmol/L — AB (ref 22–32)
CREATININE: 0.51 mg/dL (ref 0.44–1.00)
Chloride: 98 mmol/L — ABNORMAL LOW (ref 101–111)
GFR calc Af Amer: >60 mL/min (ref >60)
GFR calc non Af Amer: >60 mL/min (ref >60)
GLUCOSE: 123 mg/dL — AB (ref 65–99)
Potassium: 3.7 mmol/L (ref 3.5–5.1)
Sodium: 142 mmol/L (ref 135–145)

## 2015-07-18 LAB — GLUCOSE, CAPILLARY
GLUCOSE-CAPILLARY: 110 mg/dL — AB (ref 65–99)
Glucose-Capillary: 143 mg/dL — ABNORMAL HIGH (ref 65–99)
Glucose-Capillary: 105 mg/dL — ABNORMAL HIGH (ref 65–99)
Glucose-Capillary: 120 mg/dL — ABNORMAL HIGH (ref 65–99)

## 2015-07-18 LAB — URINE CULTURE

## 2015-07-18 MED ORDER — PROMETHAZINE HCL 25 MG/ML IJ SOLN
12.5000 mg | Freq: Four times a day (QID) | INTRAMUSCULAR | Status: DC | PRN
  Administered 2015-07-18: 12.5 mg via INTRAVENOUS
  Filled 2015-07-18: qty 1

## 2015-07-18 MED ORDER — PANTOPRAZOLE SODIUM 40 MG IV SOLR
40.0000 mg | Freq: Two times a day (BID) | INTRAVENOUS | Status: DC
  Administered 2015-07-18 – 2015-07-19 (×3): 40 mg via INTRAVENOUS
  Filled 2015-07-18 (×3): qty 40

## 2015-07-18 NOTE — Progress Notes (Signed)
[facility] - [place] at [facility]   PATIENT NAME: Meghan Patterson    MR#:  [id number]  DATE OF BIRTH:  1961-02-19  SUBJECTIVE:has nausea,vomtitng today.no abdominal pain or diarrhea.SOb is better,  CHIEF COMPLAINT:   Chief Complaint  Patient presents with  . Shortness of Breath   Patient here due to shortness of breath and noted to be in acute respiratory failure with hypoxia and hypercapnia.   REVIEW OF SYSTEMS:    Review of Systems  Constitutional: Negative for fever and chills.  HENT: Negative for congestion and tinnitus.   Eyes: Negative for blurred vision and double vision.  Respiratory: Negative for cough, shortness of breath and wheezing.   Cardiovascular: Negative for chest pain, orthopnea and PND.  Gastrointestinal: Positive for heartburn, nausea and vomiting. Negative for abdominal pain and diarrhea.  Genitourinary: Negative for dysuria and hematuria.  Neurological: Negative for dizziness, sensory change and focal weakness. Weakness: generalized.  All other systems reviewed and are negative.   Nutrition: Heart healthy Tolerating Diet: Yes   DRUG ALLERGIES:   Allergies  Allergen Reactions  . Bee Venom     VITALS:  Blood pressure 123/62, pulse 90, temperature 99.4 F (37.4 C), temperature source Oral, resp. rate 18, height 5\' 2"  (1.575 m), weight 108.954 kg (240 lb 3.2 oz), SpO2 90 %.  PHYSICAL EXAMINATION:   Physical Exam  GENERAL:  54 y.o.-year-old obese patient lying in the bed with no acute distress.  EYES: Pupils equal, round, reactive to light and accommodation. No scleral icterus. Extraocular muscles intact.  HEENT: Head atraumatic, normocephalic. Oropharynx and nasopharynx clear.  NECK:  Supple, no jugular venous distention. No thyroid enlargement, no tenderness.  LUNGS: Normal breath sounds bilaterally, no wheezing, bibasilar rales, No rhonchi. No use of accessory muscles of respiration.  CARDIOVASCULAR: S1, S2 normal. No  murmurs, rubs, or gallops.  ABDOMEN: Soft, nontender, nondistended. Bowel sounds present. No organomegaly or mass.  EXTREMITIES: No cyanosis, clubbing or edema b/l.    NEUROLOGIC: Cranial nerves II through XII are intact. No focal Motor or sensory deficits b/l.  Globally weak PSYCHIATRIC: The patient is alert and oriented x 3. Good affect SKIN: No obvious rash, lesion, or ulcer.    LABORATORY PANEL:   CBC  Recent Labs Lab 07/17/15 0248  WBC 5.4  HGB 12.0  HCT 36.8  PLT 212   ------------------------------------------------------------------------------------------------------------------  Chemistries   Recent Labs Lab 07/17/15 0248 07/18/15 0616  NA 140 142  K 4.2 3.7  CL 98* 98*  CO2 34* 36*  GLUCOSE 194* 123*  BUN 23* 19  CREATININE 0.59 0.51  CALCIUM 8.7* 8.8*  AST 30  --   ALT 24  --   ALKPHOS 71  --   BILITOT 0.4  --    ------------------------------------------------------------------------------------------------------------------  Cardiac Enzymes  Recent Labs Lab 07/17/15 1851  TROPONINI <0.03   ------------------------------------------------------------------------------------------------------------------  RADIOLOGY:  Dg Chest Port 1 View  07/17/2015   CLINICAL DATA:  Progressive shortness of breath for 3 days.  EXAM: PORTABLE CHEST 1 VIEW  COMPARISON:  Frontal and lateral views 11/10/2013  FINDINGS: Low lung volumes with elevation of right hemidiaphragm. Cardiomediastinal contours are grossly unchanged. There is vascular congestion and peribronchial cuffing. Atelectasis noted in the right mid lung zone. No large pleural effusion or evident pneumothorax. Detailed evaluation limited by low lung volumes and soft tissue attenuation from body habitus.  IMPRESSION: Hypoventilatory chest. There is vascular congestion and mild peribronchial cuffing, may reflect very early pulmonary edema. Suspect CHF. Technically  limited exam.   Electronically Signed   By:  Rubye Oaks M.D.   On: 07/17/2015 02:50     ASSESSMENT AND PLAN:   54 year old female with past medical history of hypertension, type 2 diabetes without compensation, hypothyroidism, hyperlipidemia, history of CHF, GERD, rheumatoid arthritis who presented to the hospital due to shortness of breath and noted to be in mild CHF.  #1 acute respiratory failure with hypoxia and hypercapnia-this is likely multifactorial and related to obesity pickwickian syndrome combined with mild CHF. -I  Continue  IV lasix,ECho showed EF more than 55%-Continue CPAP at bedtime. Follow clinically. Patient likely will need to be in oxygen prior to being discharged.  #2 CHF-acute on chronic diastolic dysfunction likely secondary to sleep apnea. -Diurese with IV Lasix, follow I's and O's and daily weights. -Continue Toprol, losartan..  #3 type 2 diabetes without complication-continue metformin, sliding scale insulin.  #4 hypothyroidism-continue Synthroid.  #5 hypertension-continue Norvasc, metoprolol, losartan.  #6 depression-continue Celexa.  #7 GERD-continue Protonix. 8.NAusea,vomiting;likley viral;continue  IV Zofran,IV Protonix,  All the records are reviewed and case discussed with Care Management/Social Workerr. Management plans discussed with the patient, family and they are in agreement.  CODE STATUS: Full  DVT Prophylaxis: Lovenox  TOTAL TIME TAKING CARE OF THIS PATIENT: 30 minutes.   POSSIBLE D/C IN 1-2 DAYS, DEPENDING ON CLINICAL CONDITION.   Katha Hamming M.D on 07/18/2015 at 10:47 AM  Between 7am to 6pm - Pager - [phone]  After 6pm go to [email or web address] - password [email or web address] [facility]  [place] [facility]  Office  [phone]  CC: Primary care physician; Manus Rudd LEE, DO

## 2015-07-18 NOTE — Progress Notes (Signed)
*  PRELIMINARY RESULTS* Echocardiogram 2D Echocardiogram has been performed.  Meghan Patterson 07/18/2015, 8:14 AM

## 2015-07-18 NOTE — Progress Notes (Signed)
Patient with nausea and vomited 25cc of a dark green liquid.  She received IV Zofran at 0700 and did not have anything else prn to be given.  Dr. Luberta Mutter was paged, notified of this and gave an order for IV phenergan 12.5mg  every six hours as needed.

## 2015-07-18 NOTE — Progress Notes (Addendum)
Pt with c/o nausea, no vomiting at this time. Prn zofran given per M.D. Order. Pt incontinent of urine, peri care given and bed pan changed. RT in to place C-Pap on pt at HS. Phone in reach and bed exit alarm placed. Telemetry  Monitoring SR 82, with a BBB per tele tech.  07:03am Pt called out, noted pt vomited of clear thin like emesis. Prn zofran IV given per M.D. Order. Pt noted with relief, will continue to monitor. Will f/u with dayshift.

## 2015-07-18 NOTE — Clinical Social Work Note (Signed)
Clinical Social Work Assessment  Patient Details  Name: Meghan Patterson MRN: [id number] Date of Birth: 02-05-1961  Date of referral:  07/18/15               Reason for consult:  Facility Placement (from [facility])                Permission sought to share information with:  Facility Medical sales representative, Guardian Permission granted to share information::  Yes, Verbal Permission Granted  Name::      (guardian/ sister   Meghan Patterson [phone])  Agency::     Relationship::     Contact Information:     Housing/Transportation Living arrangements for the past 2 months:  Skilled Nursing Facility Source of Information:  Patient, Guardian Patient Interpreter Needed:  None Criminal Activity/Legal Involvement Pertinent to Current Situation/Hospitalization:  No - Comment as needed Significant Relationships:  Siblings Lives with:  Facility Resident Do you feel safe going back to the place where you live?  Yes Need for family participation in patient care:  Yes (Comment) (yes patient has guardian  )  Care giving concerns:  None reported at this time   Office manager / plan:  CSW in to assess patient.  Informed CSW she lives at [facility] and has lived there over a year.    Patient point to the board for CSW to call her sister which is her guardian.  Call to patient's sister Meghan Patterson, [phone].  Would like CSW to assist with locating a facility in [facility] as she has moved to [place] today and wants her sister close to her.  Sister   CSW will complete FL2 and fax out to [facility] and [facility] for available beds.   Employment status:  Disabled (Comment on whether or not currently receiving Disability) Insurance information:  Medicare, [place] PT Recommendations:  Not assessed at this time Information / Referral to community resources:  Skilled Nursing Facility  Patient/Family's Response to care:  Sister was Adult nurse of talking with CSW,    Patient/Family's Understanding of and Emotional Response to Diagnosis, Current Treatment, and Prognosis:  Guardian understands that patient will return to [facility] when medically ready if there are no bed offers in [facility] for patient at time of discharge.  Emotional Assessment Appearance:  Appears older than stated age Attitude/Demeanor/Rapport:    Affect (typically observed):  Adaptable, Appropriate, Calm, Pleasant, Quiet Orientation:  Oriented to Self, Oriented to Place, Oriented to  Time, Oriented to Situation Alcohol / Substance use:  Never Used Psych involvement (Current and /or in the community):  No (Comment)  Discharge Needs  Concerns to be addressed:  Denies Needs/Concerns at this time Readmission within the last 30 days:  No Current discharge risk:  Chronically ill, Dependent with Mobility Barriers to Discharge:  No Barriers Identified   Soundra Pilon, LCSW 07/18/2015, 5:46 PM Meghan Patterson. Theresia Majors, MSW Clinical Social Work Department Emergency Room [phone] 5:49 PM

## 2015-07-19 LAB — GLUCOSE, CAPILLARY
GLUCOSE-CAPILLARY: 105 mg/dL — AB (ref 65–99)
GLUCOSE-CAPILLARY: 123 mg/dL — AB (ref 65–99)
Glucose-Capillary: 145 mg/dL — ABNORMAL HIGH (ref 65–99)
Glucose-Capillary: 124 mg/dL — ABNORMAL HIGH (ref 65–99)

## 2015-07-19 MED ORDER — PANTOPRAZOLE SODIUM 40 MG PO TBEC
40.0000 mg | DELAYED_RELEASE_TABLET | Freq: Two times a day (BID) | ORAL | Status: DC
  Administered 2015-07-19 – 2015-07-21 (×4): 40 mg via ORAL
  Filled 2015-07-19 (×4): qty 1

## 2015-07-19 NOTE — Progress Notes (Signed)
[facility] Physicians - [place] at [facility]   PATIENT NAME: Meghan Patterson    MR#:  [id number]  DATE OF BIRTH:  1960-12-20  SUBJECTIVE: Seen today, patient denies any nausea. Her shortness of breath.   CHIEF COMPLAINT:   Chief Complaint  Patient presents with  . Shortness of Breath   Patient here due to shortness of breath and noted to be in acute respiratory failure with hypoxia and hypercapnia.   REVIEW OF SYSTEMS:    Review of Systems  Constitutional: Negative for fever and chills.  HENT: Negative for congestion and tinnitus.   Eyes: Negative for blurred vision and double vision.  Respiratory: Negative for cough, shortness of breath and wheezing.   Cardiovascular: Negative for chest pain, orthopnea and PND.  Gastrointestinal: Negative for heartburn, nausea, vomiting, abdominal pain and diarrhea.  Genitourinary: Negative for dysuria and hematuria.  Neurological: Negative for dizziness, sensory change and focal weakness. Weakness: generalized.  All other systems reviewed and are negative.   Nutrition: Heart healthy Tolerating Diet: Yes   DRUG ALLERGIES:   Allergies  Allergen Reactions  . Bee Venom     VITALS:  Blood pressure 126/72, pulse 89, temperature 99.1 F (37.3 C), temperature source Oral, resp. rate 16, height 5\' 2"  (1.575 m), weight 108.591 kg (239 lb 6.4 oz), SpO2 86 %.  PHYSICAL EXAMINATION:   Physical Exam  GENERAL:  54 y.o.-year-old obese patient lying in the bed with no acute distress.  EYES: Pupils equal, round, reactive to light and accommodation. No scleral icterus. Extraocular muscles intact.  HEENT: Head atraumatic, normocephalic. Oropharynx and nasopharynx clear.  NECK:  Supple, no jugular venous distention. No thyroid enlargement, no tenderness.  LUNGS: Normal breath sounds bilaterally, no wheezing, bibasilar rales, No rhonchi. No use of accessory muscles of respiration.  CARDIOVASCULAR: S1, S2 normal. No murmurs, rubs, or  gallops.  ABDOMEN: Soft, nontender, nondistended. Bowel sounds present. No organomegaly or mass.  EXTREMITIES: No cyanosis, clubbing or edema b/l.    NEUROLOGIC: Cranial nerves II through XII are intact. No focal Motor or sensory deficits b/l.  Globally weak PSYCHIATRIC: The patient is alert and oriented x 3. Good affect SKIN: No obvious rash, lesion, or ulcer.    LABORATORY PANEL:   CBC  Recent Labs Lab 07/17/15 0248  WBC 5.4  HGB 12.0  HCT 36.8  PLT 212   ------------------------------------------------------------------------------------------------------------------  Chemistries   Recent Labs Lab 07/17/15 0248 07/18/15 0616  NA 140 142  K 4.2 3.7  CL 98* 98*  CO2 34* 36*  GLUCOSE 194* 123*  BUN 23* 19  CREATININE 0.59 0.51  CALCIUM 8.7* 8.8*  AST 30  --   ALT 24  --   ALKPHOS 71  --   BILITOT 0.4  --    ------------------------------------------------------------------------------------------------------------------  Cardiac Enzymes  Recent Labs Lab 07/17/15 1851  TROPONINI <0.03   ------------------------------------------------------------------------------------------------------------------  RADIOLOGY:  No results found.   ASSESSMENT AND PLAN:   54 year old female with past medical history of hypertension, type 2 diabetes without compensation, hypothyroidism, hyperlipidemia, history of CHF, GERD, rheumatoid arthritis who presented to the hospital due to shortness of breath and noted to be in mild CHF.  #1 acute respiratory failure with hypoxia and hypercapnia-this is likely multifactorial and related to obesity pickwickian syndrome combined with mild CHF. -I  .By mouth Lasix today.,ECho showed EF more than 55%-Continue CPAP at bedtime. Follow clinically. Patient likely will need to be in oxygen prior to being discharged.  #2 CHF-acute on chronic diastolic dysfunction  likely secondary to sleep apnea. -, follow I's and O's and daily  weights. -Continue Toprol, losartan.. Lasix.  #3 type 2 diabetes without complication-continue metformin, sliding scale insulin.  #4 hypothyroidism-continue Synthroid.  #5 hypertension-continue Norvasc, metoprolol, losartan.  #6 depression-continue Celexa.  #7 GERD-continue Protonix. 8.NAusea,vomiting;likley viral;continue  IV Zofran,IV Protonix, I=Dispo;SNF placement  All the records are reviewed and case discussed with Care Management/Social Workerr. Management plans discussed with the patient, family and they are in agreement.  CODE STATUS: Full  DVT Prophylaxis: Lovenox  TOTAL TIME TAKING CARE OF THIS PATIENT: 30 minutes.   POSSIBLE D/C IN 1-2 DAYS, DEPENDING ON CLINICAL CONDITION.   Katha Hamming M.D on 07/19/2015 at 10:46 AM  Between 7am to 6pm - Pager - [phone]  After 6pm go to [email or web address] - password [email or web address] [facility]  [place] [facility]  Office  [phone]  CC: Primary care physician; Manus Rudd LEE, DO

## 2015-07-19 NOTE — Progress Notes (Signed)
MEDICATION RELATED NOTE   Pharmacy Consult for IV to PO  Allergies  Allergen Reactions  . Bee Venom     Patient Measurements: Height: 5\' 2"  (157.5 cm) Weight: 239 lb 6.4 oz (108.591 kg) IBW/kg (Calculated) : 50.1   Vital Signs: Temp: 99.1 F (37.3 C) (10/02 0540) Temp Source: Oral (10/02 0540) BP: 126/72 mmHg (10/02 0540) Pulse Rate: 89 (10/02 0540) Intake/Output from previous day: 10/01 0701 - 10/02 0700 In: 543 [P.O.:540; I.V.:3] Out: 25 [Emesis/NG output:25] Intake/Output from this shift:    Labs:  Recent Labs  07/17/15 0248 07/18/15 0616  WBC 5.4  --   HGB 12.0  --   HCT 36.8  --   PLT 212  --   CREATININE 0.59 0.51  ALBUMIN 3.9  --   PROT 7.0  --   AST 30  --   ALT 24  --   ALKPHOS 71  --   BILITOT 0.4  --    Estimated Creatinine Clearance: 93.3 mL/min (by C-G formula based on Cr of 0.51).   Microbiology: Recent Results (from the past 720 hour(s))  Culture, blood (routine x 2)     Status: None (Preliminary result)   Collection Time: 07/17/15  2:47 AM  Result Value Ref Range Status   Specimen Description BLOOD  Final   Special Requests Normal  Final   Culture NO GROWTH 1 DAY  Final   Report Status PENDING  Incomplete  Culture, blood (routine x 2)     Status: None (Preliminary result)   Collection Time: 07/17/15  2:50 AM  Result Value Ref Range Status   Specimen Description BLOOD RIGHT HAND  Final   Special Requests BOTTLES DRAWN AEROBIC AND ANAEROBIC 07/19/15  Final   Culture NO GROWTH 1 DAY  Final   Report Status PENDING  Incomplete  Urine culture     Status: None   Collection Time: 07/17/15  4:04 AM  Result Value Ref Range Status   Specimen Description URINE, RANDOM  Final   Special Requests NONE  Final   Culture MULTIPLE SPECIES PRESENT, SUGGEST RECOLLECTION  Final   Report Status 07/18/2015 FINAL  Final  MRSA PCR Screening     Status: None   Collection Time: 07/17/15 10:00 AM  Result Value Ref Range Status   MRSA by PCR NEGATIVE NEGATIVE  Final    Comment:        The GeneXpert MRSA Assay (FDA approved for NASAL specimens only), is one component of a comprehensive MRSA colonization surveillance program. It is not intended to diagnose MRSA infection nor to guide or monitor treatment for MRSA infections.     Medications:  Scheduled:  . amLODipine  2.5 mg Oral Daily  . aspirin  81 mg Oral Daily  . citalopram  10 mg Oral Daily  . enoxaparin (LOVENOX) injection  40 mg Subcutaneous BID  . furosemide  20 mg Intravenous Q12H  . insulin aspart  0-5 Units Subcutaneous QHS  . insulin aspart  0-9 Units Subcutaneous TID WC  . levothyroxine  150 mcg Oral QAC breakfast  . losartan  100 mg Oral Daily  . metFORMIN  1,000 mg Oral BID WC  . metoprolol tartrate  25 mg Oral BID  . pantoprazole  40 mg Oral BID AC  . sodium chloride  3 mL Intravenous Q12H   Infusions:    Assessment: 54 yo female ordered pantoprazole 40mg  IV Q12hr.   Plan:  Patient meets IV to PO criteria. Will continue patient on pantoprazole  40mg  PO BIDac.   Simpson,Michael L 07/19/2015,10:31 AM

## 2015-07-20 LAB — GLUCOSE, CAPILLARY
GLUCOSE-CAPILLARY: 168 mg/dL — AB (ref 65–99)
Glucose-Capillary: 125 mg/dL — ABNORMAL HIGH (ref 65–99)
Glucose-Capillary: 106 mg/dL — ABNORMAL HIGH (ref 65–99)

## 2015-07-20 MED ORDER — FUROSEMIDE 20 MG PO TABS
20.0000 mg | ORAL_TABLET | Freq: Every day | ORAL | Status: DC
  Administered 2015-07-20 – 2015-07-21 (×2): 20 mg via ORAL
  Filled 2015-07-20 (×2): qty 1

## 2015-07-20 NOTE — Progress Notes (Signed)
CSW provided patient's sister/guardian, Denny Levy, via phone. She has selected bed at [facility]- they have indicated they can offer Medicaid long term bed to her.  Will await MD for medical clearance for dc-     Reece Levy, MSW, [place] [phone]

## 2015-07-20 NOTE — Progress Notes (Signed)
Notified Dr Luberta Mutter of pt BP 105/60; asked if wanted to give or hold BP meds; Dr ordered to hold morning BP medications; ordered to give lasix

## 2015-07-20 NOTE — Clinical Social Work Placement (Addendum)
   CLINICAL SOCIAL WORK PLACEMENT  NOTE  Date:  07/20/2015  Patient Details  Name: Meghan Patterson MRN: [id number] Date of Birth: 09-Dec-1960  Clinical Social Work is seeking post-discharge placement for this patient at the Skilled  Nursing Facility level of care (*CSW will initial, date and re-position this form in  chart as items are completed):  No   Patient/family provided with [place] Clinical Social Work Department's list of facilities offering this level of care within the geographic area requested by the patient (or if unable, by the patient's family).  Yes   Patient/family informed of their freedom to choose among providers that offer the needed level of care, that participate in Medicare, Medicaid or managed care program needed by the patient, have an available bed and are willing to accept the patient.  Yes   Patient/family informed of 's ownership interest in [facility] and [facility], as well as of the fact that they are under no obligation to receive care at these facilities.  PASRR submitted to EDS on       PASRR number received on       Existing PASRR number confirmed on   07/20/2015      FL2 transmitted to all facilities in geographic area requested by pt/family on 07/18/15     FL2 transmitted to all facilities within larger geographic area on       Patient informed that his/her managed care company has contracts with or will negotiate with certain facilities, including the following:        Yes   Patient/family informed of bed offers received.  Patient chooses bed at  [facility])     Physician recommends and patient chooses bed at      Patient to be transferred to   on  .  Patient to be transferred to facility by       Patient family notified on   of transfer.  Name of family member notified:        PHYSICIAN Please prepare priority discharge summary, including medications, Please sign FL2, Please prepare prescriptions      Additional Comment:    _______________________________________________ Liliana Cline, LCSW 07/20/2015, 11:04 AM

## 2015-07-20 NOTE — Care Management Important Message (Signed)
Important Message  Patient Details  Name: Meghan Patterson MRN: [id number] Date of Birth: 03-27-1961   Medicare Important Message Given:  Yes-second notification given    Adonis Huguenin, RN 07/20/2015, 11:28 AM

## 2015-07-20 NOTE — Progress Notes (Signed)
[facility] -  at [facility]   PATIENT NAME: Meghan Patterson    MR#:  [id number]  DATE OF BIRTH:  [birth year]  SUBJECTIVE: Having issues with the blood pressure, hypotensive today.   CHIEF COMPLAINT:   Chief Complaint  Patient presents with  . Shortness of Breath   Patient here due to shortness of breath and noted to be in acute respiratory failure with hypoxia and hypercapnia.   REVIEW OF SYSTEMS:    Review of Systems  Constitutional: Negative for fever and chills.  HENT: Negative for congestion and tinnitus.   Eyes: Negative for blurred vision and double vision.  Respiratory: Negative for cough, shortness of breath and wheezing.   Cardiovascular: Negative for chest pain, orthopnea and PND.  Gastrointestinal: Negative for heartburn, nausea, vomiting, abdominal pain and diarrhea.  Genitourinary: Negative for dysuria and hematuria.  Neurological: Negative for dizziness, sensory change and focal weakness. Weakness: generalized.  All other systems reviewed and are negative.   Nutrition: Heart healthy Tolerating Diet: Yes   DRUG ALLERGIES:   Allergies  Allergen Reactions  . Bee Venom     VITALS:  Blood pressure 105/60, pulse 77, temperature 98.1 F (36.7 C), temperature source Oral, resp. rate 16, height 5\' 2"  (1.575 m), weight 106.731 kg (235 lb 4.8 oz), SpO2 100 %.  PHYSICAL EXAMINATION:   Physical Exam  GENERAL:  54 y.o.-year-old obese patient lying in the bed with no acute distress.  EYES: Pupils equal, round, reactive to light and accommodation. No scleral icterus. Extraocular muscles intact.  HEENT: Head atraumatic, normocephalic. Oropharynx and nasopharynx clear.  NECK:  Supple, no jugular venous distention. No thyroid enlargement, no tenderness.  LUNGS: Normal breath sounds bilaterally, no wheezing, bibasilar rales, No rhonchi. No use of accessory muscles of respiration.  CARDIOVASCULAR: S1, S2 normal. No murmurs, rubs, or gallops.   ABDOMEN: Soft, nontender, nondistended. Bowel sounds present. No organomegaly or mass.  EXTREMITIES: No cyanosis, clubbing or edema b/l.    NEUROLOGIC: Cranial nerves II through XII are intact. No focal Motor or sensory deficits b/l.  Globally weak PSYCHIATRIC: The patient is alert and oriented x 3. Good affect SKIN: No obvious rash, lesion, or ulcer.    LABORATORY PANEL:   CBC  Recent Labs Lab 07/17/15 0248  WBC 5.4  HGB 12.0  HCT 36.8  PLT 212   ------------------------------------------------------------------------------------------------------------------  Chemistries   Recent Labs Lab 07/17/15 0248 07/18/15 0616  NA 140 142  K 4.2 3.7  CL 98* 98*  CO2 34* 36*  GLUCOSE 194* 123*  BUN 23* 19  CREATININE 0.59 0.51  CALCIUM 8.7* 8.8*  AST 30  --   ALT 24  --   ALKPHOS 71  --   BILITOT 0.4  --    ------------------------------------------------------------------------------------------------------------------  Cardiac Enzymes  Recent Labs Lab 07/17/15 1851  TROPONINI <0.03   ------------------------------------------------------------------------------------------------------------------  RADIOLOGY:  No results found.   ASSESSMENT AND PLAN:   54 year old female with past medical history of hypertension, type 2 diabetes without compensation, hypothyroidism, hyperlipidemia, history of CHF, GERD, rheumatoid arthritis who presented to the hospital due to shortness of breath and noted to be in mild CHF.  #1 acute respiratory failure with hypoxia and hypercapnia-this is likely multifactorial and related to obesity pickwickian syndrome combined with mild CHF. -improving. .By mouth Lasix today.,ECho showed EF more than 55%-Continue CPAP at bedtime. Follow clinically.  Wean off  oxygen to room air and see how much she maintains.  #2 CHF-acute on chronic diastolic dysfunction likely  secondary to sleep apnea. -, follow I's and O's and daily weights. Clinically   improved with IV Lasix. -Hypotensive this morning so we have  Held  the metoprolol, losartan , Lasix. #3 type 2 diabetes without complication-continue metformin, sliding scale insulin.  #4 hypothyroidism-continue Synthroid.  #5 hypertension; hypotensive this morning #6 depression-continue Celexa.  #7 GERD-continue Protonix. 8.NAusea,vomiting;likley viral;continue  IV Zofran,IV Protonix, I=Dispo;SNF placement  All the records are reviewed and case discussed with Care Management/Social Workerr. Management plans discussed with the patient, family and they are in agreement.  CODE STATUS: Full  DVT Prophylaxis: Lovenox  TOTAL TIME TAKING CARE OF THIS PATIENT: 30 minutes.   POSSIBLE D/C IN 1-2 DAYS, DEPENDING ON CLINICAL CONDITION.   Katha Hamming M.D on 07/20/2015 at 11:13 AM  Between 7am to 6pm - Pager - [phone]  After 6pm go to [email or web address] - password [email or web address] [facility]  [facility]  Office  [phone]  CC: Primary care physician; Manus Rudd LEE, DO

## 2015-07-21 LAB — CREATININE, SERUM
Creatinine, Ser: 0.66 mg/dL (ref 0.44–1.00)
GFR calc Af Amer: >60 mL/min (ref >60)
GFR calc non Af Amer: >60 mL/min (ref >60)

## 2015-07-21 LAB — GLUCOSE, CAPILLARY
GLUCOSE-CAPILLARY: 134 mg/dL — AB (ref 65–99)
GLUCOSE-CAPILLARY: 132 mg/dL — AB (ref 65–99)
GLUCOSE-CAPILLARY: 137 mg/dL — AB (ref 65–99)

## 2015-07-21 MED ORDER — INSULIN ASPART 100 UNIT/ML ~~LOC~~ SOLN: 0.0000 [IU] | Freq: Three times a day (TID) | SUBCUTANEOUS | Status: DC

## 2015-07-21 MED ORDER — FUROSEMIDE 20 MG PO TABS: 20.0000 mg | ORAL_TABLET | Freq: Every day | ORAL | Status: AC

## 2015-07-21 MED ORDER — METOPROLOL TARTRATE 25 MG PO TABS: 25.0000 mg | ORAL_TABLET | Freq: Two times a day (BID) | ORAL | Status: DC

## 2015-07-21 MED ORDER — INSULIN ASPART 100 UNIT/ML ~~LOC~~ SOLN: 0.0000 [IU] | Freq: Every day | SUBCUTANEOUS | Status: DC

## 2015-07-21 NOTE — Clinical Social Work Note (Signed)
Patient discharging today to [facility] in [place]. CSW has spoken to Wil[place] at [facility] and she is aware and CSW sent discharge summary. It was brought to CSW attention that patient had a CPAP. CSW spoke with patient's sister: Kathie Rhodes and she verified that patient did not have an existing CPAP at [facility]. CSW coordinated with Jasmine December and Rene Kocher at [facility] to obtain a CPAP at nursing home for patient. CSW obtained script for CPAP from MD and faxed to [facility] at [facility]. MD stated patient could be without CPAP for one to two nights if necessary. It is not anticipated that patient will have to wait. York Spaniel MSW, [place] [phone]

## 2015-07-21 NOTE — Progress Notes (Signed)
Report called to [facility] spoke to [facility] RN, patient was on CPaP at night and case manager working with doctor to see if patient cal continue CPaP at night at the facility.

## 2015-07-21 NOTE — Progress Notes (Signed)
Spoke with Marthann Schiller, [facility] rep at [phone], to notify of non-emergent EMS transport.  Auth notification reference given as [id number].   Service date range good from 07/21/15 - 10/20/15.   Gap exception requested to determine if services can be considered at an in-network level.

## 2015-07-21 NOTE — Discharge Summary (Signed)
Meghan Patterson, is a 54 y.o. female  DOB 20-Mar-1961  MRN [id number].  Admission date:  07/17/2015  Admitting Physician  Oralia Manis, MD  Discharge Date:  07/21/2015   Primary MD  Denny Levy, DO  Recommendations for primary care physician for things to follow:   Follow-up with primary doctor Dr. Amado Coe  in 1 week.  Admission Diagnosis  Shortness of breath [R06.02] Hypoxia [R09.02] Acute congestive heart failure, unspecified congestive heart failure type (HCC) [I50.9]   Discharge Diagnosis  Shortness of breath [R06.02] Hypoxia [R09.02] Acute congestive heart failure, unspecified congestive heart failure type (HCC) [I50.9]    Principal Problem:   Acute on chronic systolic CHF (congestive heart failure) (HCC) Active Problems:   GERD (gastroesophageal reflux disease)   HTN (hypertension)   Hypothyroid   Type 2 diabetes mellitus (HCC)   HLD (hyperlipidemia)      Past Medical History  Diagnosis Date  . Hypertension   . GERD (gastroesophageal reflux disease)   . Diabetes mellitus without complication   . FTT (failure to thrive) in adult   . Depressive disorder   . Hypothyroid   . Hypoxemia   . Hypercholesteremia   . Heart failure   . Magnesium deficiency   . Rheumatoid arthritis   . Intellectual disability     Past Surgical History  Procedure Laterality Date  . Abdominal hysterectomy         History of present illness and  Hospital Course:     Kindly see H&P for history of present illness and admission details, please review complete Labs, Consult reports and Test reports for all details in brief  HPI  from the history and physical done on the day of admission  54 year old female patient with intellectual disability, diabetes, hypertension, hypothyroidism, hyperlipidemia brought in  secondary to shortness of breath. Admitted to hospitalist service for CHF exacerbation.  Hospital Course   1. Acute on chronic diastolic heart failure: Patient started on IV Lasix, patient echocardiogram showed EF of more than now 55% with grade 1 diastolic dysfunction. Patient shortness of breath improved. She is a breathing while in the lungs are clear at this time. She had normal BNP. Troponins are negative for 3 times. Patient will go to [facility] for skilled nursing and she can resume the Lasix. #2. Acute respiratory failure with hypoxia and hypercapnia multifactorial secondary to obesity and pickwickian syndrome combined with mild CHF: Patient received CPAP at bedtime, Lasix. Patient will need CPAP at night for sleep apnea. She is maintaining 99% on 4 L we will wean off and see how much she maintains on room air if needed we can give oxygen during the daytime. #3 for acute on chronic diastolic heart failure she is on Lasix, losartan,metoprolol #4 intellectual disability unable to understand that and the patient cannot give reliable information. #4 hypothyroidism continue home medication #5 diabetes mellitus type 2 continue metformin and sliding scale insulin with coverage. #6. Rheumatoid arthritis. Patient takes methotrexate, fentanyl patch. #7 disposition patient came from [facility], patient family wanted her to go to different facility in [facility], now patient is going to [facility].  Discharge Condition: Stable   Follow UP      Discharge Instructions  and  Discharge Medications  CPAP at night     Medication List    TAKE these medications        amLODipine 2.5 MG tablet  Commonly known as:  NORVASC  Take 2.5 mg by mouth  daily.     aspirin 81 MG tablet  Take 81 mg by mouth daily.     citalopram 10 MG tablet  Commonly known as:  CELEXA  Take 10 mg by mouth daily.     fentaNYL 12 MCG/HR  Commonly known as:  DURAGESIC - dosed mcg/hr  Place 12.5 mcg onto  the skin every 3 (three) days.     furosemide 20 MG tablet  Commonly known as:  LASIX  Take 1 tablet (20 mg total) by mouth daily.     insulin aspart 100 UNIT/ML injection  Commonly known as:  novoLOG  Inject 0-5 Units into the skin at bedtime.     insulin aspart 100 UNIT/ML injection  Commonly known as:  novoLOG  Inject 0-9 Units into the skin 3 (three) times daily with meals.     levothyroxine 150 MCG tablet  Commonly known as:  SYNTHROID, LEVOTHROID  Take 150 mcg by mouth daily before breakfast.     losartan 100 MG tablet  Commonly known as:  COZAAR  Take 100 mg by mouth daily.     metFORMIN 1000 MG tablet  Commonly known as:  GLUCOPHAGE  Take 1,000 mg by mouth 2 (two) times daily with a meal.     methotrexate 2.5 MG tablet  Commonly known as:  RHEUMATREX  Take 20 mg by mouth once a week. Caution:Chemotherapy. Protect from light.     metoprolol tartrate 25 MG tablet  Commonly known as:  LOPRESSOR  Take 1 tablet (25 mg total) by mouth 2 (two) times daily.     pantoprazole 40 MG tablet  Commonly known as:  PROTONIX  Take 40 mg by mouth daily.          Diet and Activity recommendation: See Discharge Instructions above  Continue low-sodium ADA diet. Consults obtained -Child psychotherapist   Major procedures and Radiology Reports - PLEASE review detailed and final reports for all details, in brief -      Dg Chest Port 1 View  07/17/2015   CLINICAL DATA:  Progressive shortness of breath for 3 days.  EXAM: PORTABLE CHEST 1 VIEW  COMPARISON:  Frontal and lateral views 11/10/2013  FINDINGS: Low lung volumes with elevation of right hemidiaphragm. Cardiomediastinal contours are grossly unchanged. There is vascular congestion and peribronchial cuffing. Atelectasis noted in the right mid lung zone. No large pleural effusion or evident pneumothorax. Detailed evaluation limited by low lung volumes and soft tissue attenuation from body habitus.  IMPRESSION: Hypoventilatory chest.  There is vascular congestion and mild peribronchial cuffing, may reflect very early pulmonary edema. Suspect CHF. Technically limited exam.   Electronically Signed   By: Rubye Oaks M.D.   On: 07/17/2015 02:50    Micro Results    Recent Results (from the past 240 hour(s))  Culture, blood (routine x 2)     Status: None (Preliminary result)   Collection Time: 07/17/15  2:47 AM  Result Value Ref Range Status   Specimen Description BLOOD  Final   Special Requests Normal  Final   Culture NO GROWTH 3 DAYS  Final   Report Status PENDING  Incomplete  Culture, blood (routine x 2)     Status: None (Preliminary result)   Collection Time: 07/17/15  2:50 AM  Result Value Ref Range Status   Specimen Description BLOOD RIGHT HAND  Final   Special Requests BOTTLES DRAWN AEROBIC AND ANAEROBIC  Final   Culture NO GROWTH 3 DAYS  Final   Report Status PENDING  Incomplete  Urine culture     Status: None   Collection Time: 07/17/15  4:04 AM  Result Value Ref Range Status   Specimen Description URINE, RANDOM  Final   Special Requests NONE  Final   Culture MULTIPLE SPECIES PRESENT, SUGGEST RECOLLECTION  Final   Report Status 07/18/2015 FINAL  Final  MRSA PCR Screening     Status: None   Collection Time: 07/17/15 10:00 AM  Result Value Ref Range Status   MRSA by PCR NEGATIVE NEGATIVE Final    Comment:        The GeneXpert MRSA Assay (FDA approved for NASAL specimens only), is one component of a comprehensive MRSA colonization surveillance program. It is not intended to diagnose MRSA infection nor to guide or monitor treatment for MRSA infections.        Today   Subjective:   Meghan Patterson today has no headache,no chest abdominal pain,no new weakness tingling or numbness, feels much better wants to go home today.  Objective:   Blood pressure 115/65, pulse 79, temperature 97.7 F (36.5 C), temperature source Oral, resp. rate 17, height 5\' 2"  (1.575 m), weight 105.87 kg (233 lb  6.4 oz), SpO2 99 %.   Intake/Output Summary (Last 24 hours) at 07/21/15 0910 Last data filed at 07/21/15 0700  Gross per 24 hour  Intake    483 ml  Output      0 ml  Net    483 ml    Exam Awake Alert, Oriented x 3, No new F.N deficits, Normal affect [place].AT,PERRAL Supple Neck,No JVD, No cervical lymphadenopathy appriciated.  Symmetrical Chest wall movement, Good air movement bilaterally, CTAB RRR,No Gallops,Rubs or new Murmurs, No Parasternal Heave +ve B.Sounds, Abd Soft, Non tender, No organomegaly appriciated, No rebound -guarding or rigidity. No Cyanosis, Clubbing or edema, No new Rash or bruise  Data Review   CBC w Diff: Lab Results  Component Value Date   WBC 5.4 07/17/2015   WBC 4.5 04/06/2014   HGB 12.0 07/17/2015   HGB 13.5 04/06/2014   HCT 36.8 07/17/2015   HCT 42.8 04/06/2014   PLT 212 07/17/2015   PLT 202 04/06/2014   LYMPHOPCT 17 07/17/2015   LYMPHOPCT 36.1 04/06/2014   MONOPCT 8 07/17/2015   MONOPCT 11.8 04/06/2014   EOSPCT 2 07/17/2015   EOSPCT 5.9 04/06/2014   BASOPCT 0 07/17/2015   BASOPCT 0.6 04/06/2014    CMP: Lab Results  Component Value Date   NA 142 07/18/2015   NA 137 04/06/2014   K 3.7 07/18/2015   K 4.1 04/06/2014   CL 98* 07/18/2015   CL 98 04/06/2014   CO2 36* 07/18/2015   CO2 33* 04/06/2014   BUN 19 07/18/2015   BUN 25* 04/06/2014   CREATININE 0.66 07/21/2015   CREATININE 0.56* 04/06/2014   PROT 7.0 07/17/2015   PROT 6.3* 04/06/2014   ALBUMIN 3.9 07/17/2015   ALBUMIN 3.2* 04/06/2014   BILITOT 0.4 07/17/2015   BILITOT 0.5 04/06/2014   ALKPHOS 71 07/17/2015   ALKPHOS 65 04/06/2014   AST 30 07/17/2015   AST 37 04/06/2014   ALT 24 07/17/2015   ALT 34 04/06/2014  .   Total Time in preparing paper work, data evaluation and todays exam - 35 minutes  Debi Cousin M.D on 07/21/2015 at 9:10 AM

## 2015-07-22 LAB — CULTURE, BLOOD (ROUTINE X 2)
CULTURE: NO GROWTH
CULTURE: NO GROWTH
Special Requests: NORMAL

## 2015-07-23 ENCOUNTER — Non-Acute Institutional Stay (SKILLED_NURSING_FACILITY): Payer: Medicare Other | Admitting: Internal Medicine

## 2015-07-23 DIAGNOSIS — I5023 Acute on chronic systolic (congestive) heart failure: Secondary | ICD-10-CM | POA: Diagnosis not present

## 2015-07-23 DIAGNOSIS — E538 Deficiency of other specified B group vitamins: Secondary | ICD-10-CM

## 2015-07-23 DIAGNOSIS — R5381 Other malaise: Secondary | ICD-10-CM

## 2015-07-23 DIAGNOSIS — K219 Gastro-esophageal reflux disease without esophagitis: Secondary | ICD-10-CM | POA: Diagnosis not present

## 2015-07-23 DIAGNOSIS — E039 Hypothyroidism, unspecified: Secondary | ICD-10-CM | POA: Diagnosis not present

## 2015-07-23 DIAGNOSIS — F329 Major depressive disorder, single episode, unspecified: Secondary | ICD-10-CM | POA: Diagnosis not present

## 2015-07-23 DIAGNOSIS — E118 Type 2 diabetes mellitus with unspecified complications: Secondary | ICD-10-CM | POA: Diagnosis not present

## 2015-07-23 DIAGNOSIS — I10 Essential (primary) hypertension: Secondary | ICD-10-CM

## 2015-07-23 DIAGNOSIS — M069 Rheumatoid arthritis, unspecified: Secondary | ICD-10-CM

## 2015-07-23 NOTE — Progress Notes (Signed)
Patient ID: Meghan Patterson, female   DOB: [birth year], 54 y.o.   MRN: [id number]     [facility]   PCP: FISCHER, TIMOTHY LEE, DO  Code Status: full code  Allergies  Allergen Reactions  . Bee Venom     Chief Complaint  Patient presents with  . New Admit To SNF     HPI:  54 y.o. patient is here for short term rehabilitation post hospital admission with chf exacerbation. She required iv lasix, CPAP and was later transitioned to oxygen. She has PMH of HTN, DM, RA and intellectual disability. She is seen in her room today. Her breathing has improved. She has some dyspnea with exertion.   Review of Systems:  Constitutional: Negative for fever, chills, diaphoresis.  HENT: Negative for headache, congestion, nasal discharge Eyes: Negative for eye pain, blurred vision, double vision and discharge.  Respiratory: Negative for cough, shortness of breath and wheezing.   Cardiovascular: Negative for chest pain, palpitations, leg swelling.  Gastrointestinal: Negative for heartburn, nausea, vomiting, abdominal pain. Had bowel movement yesterday Genitourinary: Negative for dysuria and flank pain.  Musculoskeletal: Negative for back pain, falls. Skin: Negative for itching, rash.  Neurological: Negative for dizziness, tingling, focal weakness Psychiatric/Behavioral: Negative for depression.    Past Medical History  Diagnosis Date  . Hypertension   . GERD (gastroesophageal reflux disease)   . Diabetes mellitus without complication   . FTT (failure to thrive) in adult   . Depressive disorder   . Hypothyroid   . Hypoxemia   . Hypercholesteremia   . Heart failure   . Magnesium deficiency   . Rheumatoid arthritis   . Intellectual disability    Past Surgical History  Procedure Laterality Date  . Abdominal hysterectomy     Social History:   reports that she has never smoked. She does not have any smokeless tobacco history on file. She reports that she  does not drink alcohol or use illicit drugs.  Family History  Problem Relation Age of Onset  . Diabetes Mother   . Hypertension Father   . Emphysema Father     Medications:   Medication List       This list is accurate as of: 07/23/15  9:34 AM.  Always use your most recent med list.               acetaminophen 500 MG tablet  Commonly known as:  TYLENOL  Take 1,000 mg by mouth 2 (two) times daily.     amLODipine 10 MG tablet  Commonly known as:  NORVASC  Take 10 mg by mouth daily.     aspirin 81 MG chewable tablet  Chew 81 mg by mouth daily.     bisacodyl 10 MG suppository  Commonly known as:  DULCOLAX  Place 10 mg rectally every 12 (twelve) hours as needed for moderate constipation.     cholecalciferol 1000 UNITS tablet  Commonly known as:  VITAMIN D  Take 2,000 Units by mouth daily.     citalopram 10 MG tablet  Commonly known as:  CELEXA  Take 10 mg by mouth daily.     cyanocobalamin 1000 MCG/ML injection  Commonly known as:  (VITAMIN B-12)  Inject 1,000 mcg into the muscle every 30 (thirty) days.     ezetimibe 10 MG tablet  Commonly known as:  ZETIA  Take 10 mg by mouth daily.     folic acid 1 MG tablet  Commonly known as:  [employer]  Take 2 mg by mouth daily.     furosemide 20 MG tablet  Commonly known as:  LASIX  Take 60 mg by mouth daily.     furosemide 20 MG tablet  Commonly known as:  LASIX  Take 1 tablet (20 mg total) by mouth daily.     guaifenesin 100 MG/5ML syrup  Commonly known as:  ROBITUSSIN  Take 200 mg by mouth every 4 (four) hours as needed for cough.     insulin aspart 100 UNIT/ML injection  Commonly known as:  novoLOG  Inject 0-5 Units into the skin at bedtime.     insulin aspart 100 UNIT/ML injection  Commonly known as:  novoLOG  Inject 0-9 Units into the skin 3 (three) times daily with meals.     ipratropium-albuterol 0.5-2.5 (3) MG/3ML Soln  Commonly known as:  DUONEB  Take 3 mLs by nebulization 3 (three) times daily.       levothyroxine 150 MCG tablet  Commonly known as:  SYNTHROID, LEVOTHROID  Take 150 mcg by mouth every morning.     losartan 50 MG tablet  Commonly known as:  COZAAR  Take 50 mg by mouth 2 (two) times daily.     magnesium oxide 400 (241.3 MG) MG tablet  Commonly known as:  MAG-OX  Take 800 mg by mouth 2 (two) times daily.     menthol-cetylpyridinium 3 MG lozenge  Commonly known as:  CEPACOL  Take 1 lozenge by mouth every 2 (two) hours as needed for sore throat.     metFORMIN 1000 MG tablet  Commonly known as:  GLUCOPHAGE  Take 1,000 mg by mouth 2 (two) times daily with a meal.     methotrexate 2.5 MG tablet  Commonly known as:  RHEUMATREX  Take 5 mg by mouth once a week. On Thursday     metoprolol tartrate 25 MG tablet  Commonly known as:  LOPRESSOR  Take 1 tablet (25 mg total) by mouth 2 (two) times daily.     multivitamin tablet  Take 1 tablet by mouth daily.     pantoprazole 40 MG tablet  Commonly known as:  PROTONIX  Take 40 mg by mouth daily.     PAZEO 0.7 % Soln  Generic drug:  Olopatadine HCl  Place 1 drop into both eyes daily.     sucralfate 1 GM/10ML suspension  Commonly known as:  CARAFATE  Take 1 g by mouth 2 (two) times daily with a meal.         Physical Exam: BP 100/64 mmHg  Pulse 77  Temp(Src) 98.8 F (37.1 C)  Resp 16  SpO2 98%  General- adult female, obese, in no acute distress Head- normocephalic, atraumatic Nose- normal nasal mucosa, no maxillary or frontal sinus tenderness, no nasal discharge Throat- moist mucus membrane  Eyes- PERRLA, EOMI, no pallor, no icterus, no discharge, normal conjunctiva, normal sclera Neck- no cervical lymphadenopathy Cardiovascular- normal s1,s2, no murmurs Respiratory- bilateral clear to auscultation, no wheeze, no rhonchi, no crackles, no use of accessory muscles Abdomen- bowel sounds present, soft, non tender Musculoskeletal- able to move all 4 extremities, generalized weakness, leg edema  + Neurological- no focal deficit, alert and oriented to person, place and time Skin- warm and dry Psychiatry- normal mood and affect    Labs reviewed: Basic Metabolic Panel:  Recent Labs  10/18/70 0248 07/18/15 0616 07/21/15 0424  NA 140 142  --   K 4.2 3.7  --   CL 98* 98*  --  CO2 34* 36*  --   GLUCOSE 194* 123*  --   BUN 23* 19  --   CREATININE 0.59 0.51 0.66  CALCIUM 8.7* 8.8*  --    Liver Function Tests:  Recent Labs  07/17/15 0248  AST 30  ALT 24  ALKPHOS 71  BILITOT 0.4  PROT 7.0  ALBUMIN 3.9   No results for input(s): LIPASE, AMYLASE in the last 8760 hours. No results for input(s): AMMONIA in the last 8760 hours. CBC:  Recent Labs  07/17/15 0248  WBC 5.4  NEUTROABS 3.9  HGB 12.0  HCT 36.8  MCV 91.7  PLT 212   Cardiac Enzymes:  Recent Labs  07/17/15 0920 07/17/15 1137 07/17/15 1851  TROPONINI <0.03 <0.03 <0.03   BNP: Invalid input(s): POCBNP CBG:  Recent Labs  07/20/15 2236 07/21/15 0722 07/21/15 1138  GLUCAP 134* 132* 137*    Assessment/Plan  Physical deconditioning Will have him work with physical therapy and occupational therapy team to help with gait training and muscle strengthening exercises.fall precautions. Skin care. Encourage to be out of bed.   chf Stable, continue oxygen by nasal canula. Continue metoprolol, losartan and lasix, monitor daily weight, check bmp  Dm Monitor cbg, continue SSI novolog and metformin 1000 mg bid.  HTN Monitor BP, continue amlodipine 10 mg daily, lopressor 25 mg bid and losartan 50 mg bid with holding parameters, monitor BMP  RA Continue methotrexate with folic acid and prn tylenol for pain  Depression Continue celexa 10 mg daily  b12 deficiency anemia Monitor b12 level, continue monthly b12 injection  Hypothyroidism Continue levothyroxine 150 mcg daily  gerd Stable, continue pantoprazole and carafate  Goals of care: short term rehabilitation   Labs/tests ordered: cbc,  cmp 07/27/15  Family/ staff Communication: reviewed care plan with patient and nursing supervisor    Oneal Grout, MD  [facility] [phone] (Monday-Friday 8 am - 5 pm) [phone] (afterhours)

## 2015-07-27 LAB — CBC AND DIFFERENTIAL
HEMATOCRIT: 38 % (ref 36–46)
HEMOGLOBIN: 11 g/dL — AB (ref 12.0–16.0)
Platelets: 230 10*3/uL (ref 150–399)
WBC: 2.7 10^3/mL

## 2015-07-27 LAB — BASIC METABOLIC PANEL
BUN: 30 mg/dL — AB (ref 4–21)
Creatinine: 0.6 mg/dL (ref 0.5–1.1)
GLUCOSE: 125 mg/dL
POTASSIUM: 4.5 mmol/L (ref 3.4–5.3)
SODIUM: 143 mmol/L (ref 137–147)

## 2015-07-27 LAB — HEPATIC FUNCTION PANEL
ALK PHOS: 54 U/L (ref 25–125)
ALT: 18 U/L (ref 7–35)
AST: 18 U/L (ref 13–35)
BILIRUBIN, TOTAL: 0.4 mg/dL

## 2015-08-19 ENCOUNTER — Other Ambulatory Visit: Payer: Self-pay | Admitting: *Deleted

## 2015-08-19 MED ORDER — FENTANYL 12 MCG/HR TD PT72: MEDICATED_PATCH | TRANSDERMAL | Status: DC

## 2015-08-19 NOTE — Telephone Encounter (Signed)
[facility] 

## 2015-09-01 ENCOUNTER — Encounter (HOSPITAL_COMMUNITY): Payer: Self-pay | Admitting: Emergency Medicine

## 2015-09-01 ENCOUNTER — Emergency Department (HOSPITAL_COMMUNITY)
Admission: EM | Admit: 2015-09-01 | Discharge: 2015-09-02 | Disposition: A | Discharge status: Home / Self Care | Payer: Medicare Other | Attending: Emergency Medicine | Admitting: Emergency Medicine

## 2015-09-01 ENCOUNTER — Emergency Department (HOSPITAL_COMMUNITY): Payer: Medicare Other

## 2015-09-01 DIAGNOSIS — I509 Heart failure, unspecified: Secondary | ICD-10-CM | POA: Diagnosis not present

## 2015-09-01 DIAGNOSIS — Y92129 Unspecified place in nursing home as the place of occurrence of the external cause: Secondary | ICD-10-CM | POA: Diagnosis not present

## 2015-09-01 DIAGNOSIS — Y9389 Activity, other specified: Secondary | ICD-10-CM | POA: Insufficient documentation

## 2015-09-01 DIAGNOSIS — S82444A Nondisplaced spiral fracture of shaft of right fibula, initial encounter for closed fracture: Secondary | ICD-10-CM | POA: Diagnosis not present

## 2015-09-01 DIAGNOSIS — S8991XA Unspecified injury of right lower leg, initial encounter: Secondary | ICD-10-CM | POA: Diagnosis present

## 2015-09-01 DIAGNOSIS — Z79899 Other long term (current) drug therapy: Secondary | ICD-10-CM | POA: Diagnosis not present

## 2015-09-01 DIAGNOSIS — E119 Type 2 diabetes mellitus without complications: Secondary | ICD-10-CM | POA: Diagnosis not present

## 2015-09-01 DIAGNOSIS — Z8659 Personal history of other mental and behavioral disorders: Secondary | ICD-10-CM | POA: Diagnosis not present

## 2015-09-01 DIAGNOSIS — Z794 Long term (current) use of insulin: Secondary | ICD-10-CM | POA: Diagnosis not present

## 2015-09-01 DIAGNOSIS — M069 Rheumatoid arthritis, unspecified: Secondary | ICD-10-CM | POA: Diagnosis not present

## 2015-09-01 DIAGNOSIS — Y998 Other external cause status: Secondary | ICD-10-CM | POA: Insufficient documentation

## 2015-09-01 DIAGNOSIS — E039 Hypothyroidism, unspecified: Secondary | ICD-10-CM | POA: Diagnosis not present

## 2015-09-01 DIAGNOSIS — Z8719 Personal history of other diseases of the digestive system: Secondary | ICD-10-CM | POA: Diagnosis not present

## 2015-09-01 DIAGNOSIS — I10 Essential (primary) hypertension: Secondary | ICD-10-CM | POA: Insufficient documentation

## 2015-09-01 MED ORDER — OXYCODONE-ACETAMINOPHEN 5-325 MG PO TABS
1.0000 | ORAL_TABLET | Freq: Once | ORAL | Status: AC
  Administered 2015-09-01: 1 via ORAL
  Filled 2015-09-01: qty 1

## 2015-09-01 NOTE — ED Notes (Addendum)
Pt presents via EMS from [facility].  They found today that she has a spiral fracture of her right fibula, unknown date of injury.  Pt reports 6/10 pain in the right lower leg but denies other symptoms.  Pt states that she has not fallen and is unsure of how she could have hurt her leg.  She is on 4L oxygen nasal cannula which she states is baseline due to persistent SOB.  Pt has x-rays and physician's notes from 08/27/15 in the room.
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
## 2015-09-01 NOTE — Discharge Instructions (Signed)
You have been seen today for a right lower leg fracture. This is unknown fracture with no new changes. You should follow up with her PCP on this matter. Return to ED should symptoms worsen.

## 2015-09-01 NOTE — ED Provider Notes (Signed)
Patient complains of mild right lower leg pain. No other complaint. Pain is mild at present. On exam alert no distress right lower extremity skin intact no soft tissue swelling. Minimally tender at lateral aspect of lower leg. DP pulse 2+  Doug Sou, MD 09/01/15 2240

## 2015-09-01 NOTE — ED Notes (Signed)
Bed: RF16 Expected date:  Expected time:  Means of arrival:  Comments: EMS- 54yo F, fibula fracture

## 2015-09-21 ENCOUNTER — Other Ambulatory Visit: Payer: Self-pay | Admitting: *Deleted

## 2015-09-21 ENCOUNTER — Encounter: Payer: Self-pay | Admitting: Internal Medicine

## 2015-09-21 MED ORDER — FENTANYL 12 MCG/HR TD PT72
MEDICATED_PATCH | TRANSDERMAL | Status: DC
Start: 2015-09-21 — End: 2015-11-20

## 2015-09-21 NOTE — Telephone Encounter (Signed)
[facility] 

## 2015-10-23 ENCOUNTER — Non-Acute Institutional Stay (SKILLED_NURSING_FACILITY): Payer: Medicare Other | Admitting: Internal Medicine

## 2015-10-23 DIAGNOSIS — E118 Type 2 diabetes mellitus with unspecified complications: Secondary | ICD-10-CM | POA: Diagnosis not present

## 2015-10-23 DIAGNOSIS — R509 Fever, unspecified: Secondary | ICD-10-CM | POA: Diagnosis not present

## 2015-10-23 DIAGNOSIS — I10 Essential (primary) hypertension: Secondary | ICD-10-CM | POA: Diagnosis not present

## 2015-10-23 DIAGNOSIS — E559 Vitamin D deficiency, unspecified: Secondary | ICD-10-CM | POA: Diagnosis not present

## 2015-10-23 DIAGNOSIS — J690 Pneumonitis due to inhalation of food and vomit: Secondary | ICD-10-CM | POA: Diagnosis not present

## 2015-10-23 DIAGNOSIS — I5022 Chronic systolic (congestive) heart failure: Secondary | ICD-10-CM

## 2015-10-23 DIAGNOSIS — J309 Allergic rhinitis, unspecified: Secondary | ICD-10-CM | POA: Diagnosis not present

## 2015-10-23 DIAGNOSIS — F329 Major depressive disorder, single episode, unspecified: Secondary | ICD-10-CM | POA: Diagnosis not present

## 2015-10-23 NOTE — Progress Notes (Signed)
Patient ID: Meghan Patterson, female   DOB: [birth year], 55 y.o.   MRN: [id number]     [facility]   PCP: FISCHER, TIMOTHY LEE, DO  Code Status: full code  Allergies  Allergen Reactions  . Bee Venom     Chief Complaint  Patient presents with  . Medical Management of Chronic Issues     HPI:  55 y.o. patient is seen for routine visit. She had nausea and vomiting yesterday with fever of 102.2 . She had labs and cxr ordered and was started on im ceftriaxone. She denies any vomiting today but has some nausea. cxr was unrevealing per nursing, no result for review. Labs from this am pending. She has PMH of HTN, DM, RA and intellectual disability. She has runny nose with clear nasal discharge.    Review of Systems:  Constitutional: Negative for fever today HENT: Negative for congestion Eyes: Negative for double vision and discharge.  Respiratory: has some shortness of breath and on o2. Negative for wheezing.   Cardiovascular: Negative for chest pain, palpitations, leg swelling.  Gastrointestinal: Negative for heartburn, vomiting, abdominal pain. Had bowel movement yesterday Genitourinary: Negative for dysuria and flank pain.  Musculoskeletal: Negative for back pain Skin: Negative for itching, rash.  Neurological: Negative for dizziness Psychiatric/Behavioral: Negative for depression.    Past Medical History  Diagnosis Date  . Hypertension   . GERD (gastroesophageal reflux disease)   . Diabetes mellitus without complication (HCC)   . FTT (failure to thrive) in adult   . Depressive disorder   . Hypothyroid   . Hypoxemia   . Hypercholesteremia   . Heart failure (HCC)   . Magnesium deficiency   . Rheumatoid arthritis (HCC)   . Intellectual disability    Past Surgical History  Procedure Laterality Date  . Abdominal hysterectomy     Social History:   reports that she has never smoked. She does not have any smokeless tobacco history on file.  She reports that she does not drink alcohol or use illicit drugs.  Family History  Problem Relation Age of Onset  . Diabetes Mother   . Hypertension Father   . Emphysema Father     Medications:   Medication List       This list is accurate as of: 10/23/15 11:59 PM.  Always use your most recent med list.               amLODipine 2.5 MG tablet  Commonly known as:  NORVASC  Take 2.5 mg by mouth daily.     aspirin 81 MG chewable tablet  Chew 81 mg by mouth daily.     fentaNYL 12 MCG/HR  Commonly known as:  DURAGESIC - dosed mcg/hr  Apply one patch every 72 hours for pain. Remove old patch. External Use. Rotate sites     folic acid 1 MG tablet  Commonly known as:  FOLVITE  Take 1 mg by mouth daily.     furosemide 20 MG tablet  Commonly known as:  LASIX  Take 1 tablet (20 mg total) by mouth daily.     insulin aspart 100 UNIT/ML injection  Commonly known as:  novoLOG  Inject 0-5 Units into the skin at bedtime. 150-199=1 unit, 200-249=2 units, 250-299= 3 units, 300-349= 4 units, over 349= 5 units     insulin aspart 100 UNIT/ML injection  Commonly known as:  novoLOG  Inject 0-9 Units into the skin 3 (three) times daily with meals. 121-150=1  unit, 151-200=2 units, 201-250= 3 units, 251-300=5 units, 301-350=7 units, 351-400=9 units, OVER 400 notify MD     levothyroxine 150 MCG tablet  Commonly known as:  SYNTHROID, LEVOTHROID  Take 150 mcg by mouth every morning.     losartan 100 MG tablet  Commonly known as:  COZAAR  Take 100 mg by mouth daily.     metFORMIN 1000 MG tablet  Commonly known as:  GLUCOPHAGE  Take 1,000 mg by mouth 2 (two) times daily with a meal.     methotrexate 2.5 MG tablet  Commonly known as:  RHEUMATREX  Give 8 tablets (20 mg) by mouth On Thursday     metoprolol tartrate 25 MG tablet  Commonly known as:  LOPRESSOR  Take 1 tablet (25 mg total) by mouth 2 (two) times daily.     pantoprazole 40 MG tablet  Commonly known as:  PROTONIX  Take 40  mg by mouth daily.     PROCEL Powd  Take 1 scoop by mouth 2 (two) times daily.         Physical Exam: BP 123/76 mmHg  Pulse 89  Temp(Src) 98.2 F (36.8 C)  Resp 18  SpO2 91%  General- adult female, obese, in no acute distress Head- normocephalic, atraumatic Nose- no maxillary or frontal sinus tenderness, clear nasal discharge Throat- moist mucus membrane  Eyes- PERRLA, EOMI, no pallor, no icterus, no discharge, normal conjunctiva, normal sclera Neck- no cervical lymphadenopathy Cardiovascular- normal s1,s2, no murmurs Respiratory- bilateral clear to auscultation, no wheeze, no rhonchi, no crackles, no use of accessory muscles Abdomen- bowel sounds present, soft, non tender Musculoskeletal- able to move all 4 extremities, generalized weakness, leg edema + Neurological- no focal deficit, alert and oriented to person, place and time Skin- warm and dry Psychiatry- normal mood and affect    Labs reviewed: Basic Metabolic Panel:  Recent Labs  68/61/68 0248 07/18/15 0616 07/21/15 0424 07/27/15  NA 140 142  --  143  K 4.2 3.7  --  4.5  CL 98* 98*  --   --   CO2 34* 36*  --   --   GLUCOSE 194* 123*  --   --   BUN 23* 19  --  30*  CREATININE 0.59 0.51 0.66 0.6  CALCIUM 8.7* 8.8*  --   --    Liver Function Tests:  Recent Labs  07/17/15 0248 07/27/15  AST 30 18  ALT 24 18  ALKPHOS 71 54  BILITOT 0.4  --   PROT 7.0  --   ALBUMIN 3.9  --    No results for input(s): LIPASE, AMYLASE in the last 8760 hours. No results for input(s): AMMONIA in the last 8760 hours. CBC:  Recent Labs  07/17/15 0248 07/27/15  WBC 5.4 2.7  NEUTROABS 3.9  --   HGB 12.0 11.0*  HCT 36.8 38  MCV 91.7  --   PLT 212 230   Cardiac Enzymes:  Recent Labs  07/17/15 0920 07/17/15 1137 07/17/15 1851  TROPONINI <0.03 <0.03 <0.03   Lab Results  Component Value Date   HGBA1C 5.7 07/17/2015    10/06/15 wbc 2.7, hb 10.7, hct 35.4, plt 195, a1c 5.6, na 140, k 4.2, bun 32, cr 0.52,  lft wnl, ldl 99, hdl 40.2, tg 111, t.chol 161, pth 26.68, tsh 1.479, vit d 17.09   Assessment/Plan  Aspiration pneumonia With her vomiting yesterday followed by temperature spike and negative cxr, concern for possible aspiration. Start levquin 500 mg po daily x  1 week. Continue florastor. Aspiration precautions for now. Consider SLP consult if needed  Fever Single temprature spike. On prn tylenol and vital check. Send u/a with c/s. cbc with diff, cmp pending from this am. cxr negative for infiltrates. monitor  HTN bp on lower side of normal. discontinue norvasc . Continue lopressor and losartan with lasix and monitor bo q shift  Vitamin d def Start vitamin d 50,000 iu once a week and monitor  chf Stable, continue oxygen by nasal canula. Continue metoprolol, losartan and lasix, monitor weight  Chronic Depression Continue celexa 10 mg daily  Allergic rhinitis Start claritin 10 mg daily x 1 week , then only prn and monitor  DM type 2 Reviewed a1c from 9/16. On metformin, recheck a1c   Family/ staff Communication: reviewed care plan with patient and nursing supervisor    Oneal Grout, MD  [facility] [phone] (Monday-Friday 8 am - 5 pm) [phone] (afterhours)

## 2015-10-26 DIAGNOSIS — I5022 Chronic systolic (congestive) heart failure: Secondary | ICD-10-CM

## 2015-10-26 DIAGNOSIS — F329 Major depressive disorder, single episode, unspecified: Secondary | ICD-10-CM | POA: Insufficient documentation

## 2015-10-26 DIAGNOSIS — I5032 Chronic diastolic (congestive) heart failure: Secondary | ICD-10-CM | POA: Insufficient documentation

## 2015-11-20 ENCOUNTER — Other Ambulatory Visit: Payer: Self-pay | Admitting: *Deleted

## 2015-11-20 MED ORDER — FENTANYL 12 MCG/HR TD PT72: MEDICATED_PATCH | TRANSDERMAL | Status: DC

## 2015-11-20 NOTE — Telephone Encounter (Signed)
[facility] 

## 2015-11-23 ENCOUNTER — Encounter: Payer: Self-pay | Admitting: Internal Medicine

## 2015-11-23 ENCOUNTER — Non-Acute Institutional Stay (SKILLED_NURSING_FACILITY): Payer: Medicare Other | Admitting: Internal Medicine

## 2015-11-23 DIAGNOSIS — N952 Postmenopausal atrophic vaginitis: Secondary | ICD-10-CM

## 2015-11-23 DIAGNOSIS — I5022 Chronic systolic (congestive) heart failure: Secondary | ICD-10-CM | POA: Diagnosis not present

## 2015-11-23 DIAGNOSIS — K219 Gastro-esophageal reflux disease without esophagitis: Secondary | ICD-10-CM

## 2015-11-23 DIAGNOSIS — N390 Urinary tract infection, site not specified: Secondary | ICD-10-CM

## 2015-11-23 DIAGNOSIS — E118 Type 2 diabetes mellitus with unspecified complications: Secondary | ICD-10-CM | POA: Diagnosis not present

## 2015-11-23 DIAGNOSIS — B9620 Unspecified Escherichia coli [E. coli] as the cause of diseases classified elsewhere: Secondary | ICD-10-CM

## 2015-11-23 NOTE — Progress Notes (Signed)
Patient ID: Meghan Patterson, female   DOB: 12/11/1960, 55 y.o.   MRN: [id number]     [facility]   PCP: FISCHER, TIMOTHY LEE, DO  Code Status: Full Code  Allergies  Allergen Reactions  . Bee Venom     Chief Complaint  Patient presents with  . Medical Management of Chronic Issues    Routine Visit    Advanced Directives 09/21/2015  Does patient have an advance directive? Yes  Type of Advance Directive Healthcare Power of Attorney  Does patient want to make changes to advanced directive? No - Patient declined  Copy of advanced directive(s) in chart? No - copy requested     HPI:  55 y.o. patient is seen for routine visit. She has been working with restorative team and using walker with them. Using wheelchair at other times. She complaints of burning with urination. Denies increased urinary frequency or hematuria. Complaints of vaginal itching. denies vaginal discharge. Denies fever. Currently being treated for e.coli UTI. She has PMH of HTN, DM, RA and intellectual disability. She would like to be seen by an eye doctor for routine checkup. She wears glasses. cbg reviewed. Reading mostly < 150.   Review of Systems:  Constitutional: Negative for fever and diaphoresis HENT: Negative for congestion Eyes: Negative for double vision and discharge.  Respiratory: has some shortness of breath and on o2. Negative for wheezing.   Cardiovascular: Negative for chest pain, palpitations.  Gastrointestinal: Negative for heartburn, vomiting, nausea, abdominal pain. Had bowel movement yesterday Genitourinary: Negative for flank pain.  Musculoskeletal: Negative for back pain Skin: Negative for itching, rash.  Neurological: Negative for dizziness Psychiatric/Behavioral: Negative for depression.    Past Medical History  Diagnosis Date  . Hypertension   . GERD (gastroesophageal reflux disease)   . Diabetes mellitus without complication (HCC)   . FTT (failure  to thrive) in adult   . Depressive disorder   . Hypothyroid   . Hypoxemia   . Hypercholesteremia   . Heart failure (HCC)   . Magnesium deficiency   . Rheumatoid arthritis (HCC)   . Intellectual disability     Medications:   Medication List       This list is accurate as of: 11/23/15 11:21 AM.  Always use your most recent med list.               amLODipine 2.5 MG tablet  Commonly known as:  NORVASC  Take 2.5 mg by mouth daily.     aspirin 81 MG chewable tablet  Chew 81 mg by mouth daily.     cefUROXime 250 MG tablet  Commonly known as:  CEFTIN  Take 250 mg by mouth 2 (two) times daily with a meal. For 7 days ending on February 6th, 2017     citalopram 10 MG tablet  Commonly known as:  CELEXA  Take 10 mg by mouth daily.     fentaNYL 12 MCG/HR  Commonly known as:  DURAGESIC - dosed mcg/hr  Apply one patch every 72 hours for pain. Remove old patch. External Use. Rotate sites     folic acid 1 MG tablet  Commonly known as:  FOLVITE  Take 1 mg by mouth daily.     furosemide 20 MG tablet  Commonly known as:  LASIX  Take 1 tablet (20 mg total) by mouth daily.     insulin aspart 100 UNIT/ML injection  Commonly known as:  novoLOG  Inject 0-5 Units into the skin  at bedtime. 150-199=1 unit, 200-249=2 units, 250-299= 3 units, 300-349= 4 units, over 349= 5 units     insulin aspart 100 UNIT/ML injection  Commonly known as:  novoLOG  Inject 0-9 Units into the skin 3 (three) times daily with meals. 121-150=1 unit, 151-200=2 units, 201-250= 3 units, 251-300=5 units, 301-350=7 units, 351-400=9 units, OVER 400 notify MD     levothyroxine 150 MCG tablet  Commonly known as:  SYNTHROID, LEVOTHROID  Take 150 mcg by mouth every morning.     losartan 100 MG tablet  Commonly known as:  COZAAR  Take 100 mg by mouth daily.     metFORMIN 1000 MG tablet  Commonly known as:  GLUCOPHAGE  Take 1,000 mg by mouth 2 (two) times daily with a meal.     methotrexate 2.5 MG tablet  Commonly  known as:  RHEUMATREX  Give 8 tablets (20 mg) by mouth On Thursday     metoprolol tartrate 25 MG tablet  Commonly known as:  LOPRESSOR  Take 1 tablet (25 mg total) by mouth 2 (two) times daily.     pantoprazole 40 MG tablet  Commonly known as:  PROTONIX  Take 40 mg by mouth daily.     PROCEL Powd  Take 1 scoop by mouth 2 (two) times daily.     Vitamin D (Ergocalciferol) 50000 units Caps capsule  Commonly known as:  DRISDOL  Take 50,000 Units by mouth. For 12 weeks         Physical Exam: BP 124/61 mmHg  Pulse 75  Temp(Src) 97.1 F (36.2 C) (Oral)  Resp 18  Ht 5\' 2"  (1.575 m)  Wt 250 lb (113.399 kg)  BMI 45.71 kg/m2  SpO2 96%  Wt Readings from Last 3 Encounters:  11/23/15 250 lb (113.399 kg)  09/21/15 250 lb (113.399 kg)  09/01/15 245 lb (111.131 kg)    General- adult female, obese, in no acute distress Head- normocephalic, atraumatic Nose- no maxillary or frontal sinus tenderness, no nasal discharge Throat- moist mucus membrane  Eyes- PERRLA, EOMI, no pallor, no icterus, no discharge, normal conjunctiva, normal sclera Neck- no cervical lymphadenopathy Cardiovascular- normal s1,s2, no murmurs Respiratory- bilateral clear to auscultation, no wheeze, no rhonchi, no crackles, no use of accessory muscles, on o2 Abdomen- bowel sounds present, soft, non tender, no suprapubic tenderness Genitalia- no vaginal discharge, erythema to her labia noted with vaginal atrophy Musculoskeletal- able to move all 4 extremities, generalized weakness, leg edema + Neurological- no focal deficit, alert and oriented to person, place and time Skin- warm and dry Psychiatry- normal mood and affect    Labs reviewed: Basic Metabolic Panel:  Recent Labs  09/03/15 0248 07/18/15 0616 07/21/15 0424 07/27/15  NA 140 142  --  143  K 4.2 3.7  --  4.5  CL 98* 98*  --   --   CO2 34* 36*  --   --   GLUCOSE 194* 123*  --   --   BUN 23* 19  --  30*  CREATININE 0.59 0.51 0.66 0.6  CALCIUM  8.7* 8.8*  --   --    Liver Function Tests:  Recent Labs  07/17/15 0248 07/27/15  AST 30 18  ALT 24 18  ALKPHOS 71 54  BILITOT 0.4  --   PROT 7.0  --   ALBUMIN 3.9  --     CBC:  Recent Labs  07/17/15 0248 07/27/15  WBC 5.4 2.7  NEUTROABS 3.9  --   HGB 12.0 11.0*  HCT 36.8 38  MCV 91.7  --   PLT 212 230   Cardiac Enzymes:  Recent Labs  07/17/15 0920 07/17/15 1137 07/17/15 1851  TROPONINI <0.03 <0.03 <0.03   Lab Results  Component Value Date   HGBA1C 5.7 07/17/2015    10/06/15 wbc 2.7, hb 10.7, hct 35.4, plt 195, a1c 5.6, na 140, k 4.2, bun 32, cr 0.52, lft wnl, ldl 99, hdl 40.2, tg 111, t.chol 161, pth 26.68, tsh 1.479, vit d 17.09     Assessment/Plan  E.coli uti Continue and complete a week's course of ceftin 250 mg bid. Maintain hydration. Check u/a with c/s 11/24/15. Start cranberry supplement.  Atrophic vaginitis Start estrace cream daily x 1 month, then 3 days a week and monitor  Chronic systolic CHF Stable, continue oxygen by nasal canula. Continue metoprolol, losartan and lasix, monitor weight.  Wt Readings from Last 3 Encounters:  11/23/15 250 lb (113.399 kg)  09/21/15 250 lb (113.399 kg)  09/01/15 245 lb (111.131 kg)   gerd Stable symptom. Reduce protonix to 20 mg daily and monitor  DM type 2 Reviewed a1c 5.9 on 10/24/15. currently on metformin 1000 mg bid and SSI novolog. Discontinue her novolog. Monitor cbg twice daily for now. Check lipid panel. Continue aspirin and losartan. Check urine microalbumin and get ophthalmology consult for diabetic eye exam   Family/ staff Communication: reviewed care plan with patient and nursing supervisor    Oneal Grout, MD  [facility] [phone] (Monday-Friday 8 am - 5 pm) [phone] (afterhours)

## 2015-11-24 LAB — LIPID PANEL
Cholesterol: 195 mg/dL (ref 0–200)
HDL: 53 mg/dL (ref 35–70)
LDL CALC: 121 mg/dL
TRIGLYCERIDES: 105 mg/dL (ref 40–160)

## 2015-11-24 LAB — MICROALBUMIN, URINE: MICROALB UR: 1.4

## 2015-11-29 NOTE — Progress Notes (Signed)
This encounter was created in error - please disregard.

## 2015-12-21 ENCOUNTER — Non-Acute Institutional Stay (SKILLED_NURSING_FACILITY): Payer: Medicare Other | Admitting: Internal Medicine

## 2015-12-21 ENCOUNTER — Encounter: Payer: Self-pay | Admitting: Internal Medicine

## 2015-12-21 DIAGNOSIS — E039 Hypothyroidism, unspecified: Secondary | ICD-10-CM

## 2015-12-21 DIAGNOSIS — I10 Essential (primary) hypertension: Secondary | ICD-10-CM | POA: Diagnosis not present

## 2015-12-21 DIAGNOSIS — J3089 Other allergic rhinitis: Secondary | ICD-10-CM | POA: Diagnosis not present

## 2015-12-21 DIAGNOSIS — M069 Rheumatoid arthritis, unspecified: Secondary | ICD-10-CM | POA: Diagnosis not present

## 2015-12-21 NOTE — Progress Notes (Signed)
Patient ID: Meghan Patterson, female   DOB: 1961/01/27, 55 y.o.   MRN: [id number]     [facility]   PCP: FISCHER, TIMOTHY LEE, DO  Code Status: Full Code  Allergies  Allergen Reactions  . Bee Venom     Chief Complaint  Patient presents with  . Medical Management of Chronic Issues    Routine Visit    Advanced Directives 09/21/2015  Does patient have an advance directive? Yes  Type of Advance Directive Healthcare Power of Attorney  Does patient want to make changes to advanced directive? No - Patient declined  Copy of advanced directive(s) in chart? No - copy requested     HPI:  55 y.o. patient is seen for routine visit. She has been following with ENT for her hearing loss and T tube placement has been recommended. Note reviewed. She complaints of stuffiness in her nose and cough with sinus congestion for few days. She continues to work with restorative team. Denies any other concerns. She is tolerating reduced dosing of methotrexate well.   Review of Systems:  Constitutional: Negative for fever and diaphoresis Eyes: Negative for double vision and discharge.  Respiratory: has some shortness of breath and on o2. Negative for wheezing.   Cardiovascular: Negative for chest pain, palpitations.  Gastrointestinal: Negative for heartburn, vomiting, nausea, abdominal pain. Had bowel movement yesterday Genitourinary: Negative for flank pain.  Musculoskeletal: Negative for back pain Skin: Negative for itching, rash.  Neurological: Negative for dizziness Psychiatric/Behavioral: Negative for depression.    Past Medical History  Diagnosis Date  . Hypertension   . GERD (gastroesophageal reflux disease)   . Diabetes mellitus without complication (HCC)   . FTT (failure to thrive) in adult   . Depressive disorder   . Hypothyroid   . Hypoxemia   . Hypercholesteremia   . Heart failure (HCC)   . Magnesium deficiency   . Rheumatoid arthritis (HCC)   .  Intellectual disability     Medications:   Medication List       This list is accurate as of: 12/21/15  3:42 PM.  Always use your most recent med list.               amLODipine 2.5 MG tablet  Commonly known as:  NORVASC  Take 2.5 mg by mouth daily.     aspirin 81 MG chewable tablet  Chew 81 mg by mouth daily.     citalopram 10 MG tablet  Commonly known as:  CELEXA  Take 10 mg by mouth daily.     Cranberry 250 MG Caps  Take 250 mg by mouth 2 (two) times daily.     estradiol 0.1 MG/GM vaginal cream  Commonly known as:  ESTRACE  Place 1 Applicatorful vaginally daily. For 1 month and then 3 times a week. Stop date 12/22/15     fentaNYL 12 MCG/HR  Commonly known as:  DURAGESIC - dosed mcg/hr  Apply one patch every 72 hours for pain. Remove old patch. External Use. Rotate sites     folic acid 1 MG tablet  Commonly known as:  FOLVITE  Take 1 mg by mouth daily.     furosemide 20 MG tablet  Commonly known as:  LASIX  Take 1 tablet (20 mg total) by mouth daily.     ipratropium-albuterol 0.5-2.5 (3) MG/3ML Soln  Commonly known as:  DUONEB  Take 3 mLs by nebulization every 6 (six) hours as needed.     levothyroxine  150 MCG tablet  Commonly known as:  SYNTHROID, LEVOTHROID  Take 150 mcg by mouth every morning.     loratadine 10 MG tablet  Commonly known as:  CLARITIN  Take 10 mg by mouth daily as needed for allergies.     losartan 100 MG tablet  Commonly known as:  COZAAR  Take 100 mg by mouth daily.     metFORMIN 1000 MG tablet  Commonly known as:  GLUCOPHAGE  Take 1,000 mg by mouth 2 (two) times daily with a meal.     methotrexate 2.5 MG tablet  Commonly known as:  RHEUMATREX  Take 2.5 mg by mouth once a week. Caution:Chemotherapy. Protect from light. Give 2 tablets= 5 mg by mouth weekly     metoprolol tartrate 25 MG tablet  Commonly known as:  LOPRESSOR  Take 1 tablet (25 mg total) by mouth 2 (two) times daily.     pantoprazole 40 MG tablet  Commonly known  as:  PROTONIX  Take 40 mg by mouth daily.     PROCEL Powd  Take 1 scoop by mouth 2 (two) times daily.     Vitamin D (Ergocalciferol) 50000 units Caps capsule  Commonly known as:  DRISDOL  Take 50,000 Units by mouth. For 12 weeks         Physical Exam: BP 160/90 mmHg  Pulse 77  Temp(Src) 98.4 F (36.9 C) (Oral)  Resp 20  Ht 5\' 2"  (1.575 m)  Wt 247 lb 9.6 oz (112.311 kg)  BMI 45.28 kg/m2  SpO2 95%  Wt Readings from Last 3 Encounters:  12/21/15 247 lb 9.6 oz (112.311 kg)  11/23/15 250 lb (113.399 kg)  09/21/15 250 lb (113.399 kg)    General- adult female, obese, in no acute distress Head- normocephalic, atraumatic Nose- no maxillary or frontal sinus tenderness, clear nasal discharge Throat- moist mucus membrane  Eyes- PERRLA, EOMI, no pallor, no icterus Neck- no cervical lymphadenopathy Cardiovascular- normal s1,s2, no murmur Respiratory- bilateral clear to auscultation, no wheeze, no rhonchi, no crackles, no use of accessory muscles, on o2 Abdomen- bowel sounds present, soft, non tender, no suprapubic tenderness Musculoskeletal- able to move all 4 extremities, leg edema +, on wheelchair Neurological-  alert and oriented to person, place and time Skin- warm and dry Psychiatry- normal mood and affect    Labs reviewed: Basic Metabolic Panel:  Recent Labs  14/05/16 0248 07/18/15 0616 07/21/15 0424 07/27/15  NA 140 142  --  143  K 4.2 3.7  --  4.5  CL 98* 98*  --   --   CO2 34* 36*  --   --   GLUCOSE 194* 123*  --   --   BUN 23* 19  --  30*  CREATININE 0.59 0.51 0.66 0.6  CALCIUM 8.7* 8.8*  --   --    Liver Function Tests:  Recent Labs  07/17/15 0248 07/27/15  AST 30 18  ALT 24 18  ALKPHOS 71 54  BILITOT 0.4  --   PROT 7.0  --   ALBUMIN 3.9  --     CBC:  Recent Labs  07/17/15 0248 07/27/15  WBC 5.4 2.7  NEUTROABS 3.9  --   HGB 12.0 11.0*  HCT 36.8 38  MCV 91.7  --   PLT 212 230   Cardiac Enzymes:  Recent Labs  07/17/15 0920  07/17/15 1137 07/17/15 1851  TROPONINI <0.03 <0.03 <0.03   Lab Results  Component Value Date   HGBA1C 5.7 07/17/2015  Lipid Panel     Component Value Date/Time   CHOL 195 11/24/2015   TRIG 105 11/24/2015   HDL 53 11/24/2015   LDLCALC 121 11/24/2015      Assessment/Plan  Allergic rhinitis With cough and runny nose, change loratadine to 10 mg daily for now for 1 week, then as needed and monitor  RA Continue lowered dose of methotrexate 5 mg once a week and check cbc. Continue folic acid  Hypertension Has elevated BP. Goal BP <140/90.  Continue lasix 20 mg daily, losartan 100 mg daily, lopressor 25 mg bid and increase norvasc to 5 mg daily. Check bp bid for now. Check bmp  Hypothyroidism Lab Results  Component Value Date   TSH 2.17 11/05/2013  check tsh and free t4. Continue levothyroxine 150 mcg daily for now.    Family/ staff Communication: reviewed care plan with patient and nursing supervisor    Oneal Grout, MD  [facility] [phone] (Monday-Friday 8 am - 5 pm) [phone] (afterhours)

## 2016-01-27 ENCOUNTER — Encounter: Payer: Self-pay | Admitting: Internal Medicine

## 2016-01-27 ENCOUNTER — Non-Acute Institutional Stay (SKILLED_NURSING_FACILITY): Payer: Medicare Other | Admitting: Internal Medicine

## 2016-01-27 DIAGNOSIS — N390 Urinary tract infection, site not specified: Secondary | ICD-10-CM

## 2016-01-27 DIAGNOSIS — R600 Localized edema: Secondary | ICD-10-CM

## 2016-01-27 DIAGNOSIS — K219 Gastro-esophageal reflux disease without esophagitis: Secondary | ICD-10-CM | POA: Diagnosis not present

## 2016-01-27 DIAGNOSIS — H04123 Dry eye syndrome of bilateral lacrimal glands: Secondary | ICD-10-CM | POA: Diagnosis not present

## 2016-01-27 DIAGNOSIS — J309 Allergic rhinitis, unspecified: Secondary | ICD-10-CM

## 2016-01-27 NOTE — Progress Notes (Signed)
Patient ID: Meghan Patterson, female   DOB: 10/08/1961, 55 y.o.   MRN: [id number]     [facility]   PCP: FISCHER, TIMOTHY LEE, DO  Code Status: Full Code  Allergies  Allergen Reactions  . Bee Venom     Chief Complaint  Patient presents with  . Medical Management of Chronic Issues    Routine Visit    Advanced Directives 09/21/2015  Does patient have an advance directive? Yes  Type of Advance Directive Healthcare Power of Attorney  Does patient want to make changes to advanced directive? No - Patient declined  Copy of advanced directive(s) in chart? No - copy requested     HPI:  55 y.o. patient is seen for routine visit. She complaints of dry cough with runny nose and clear nasal discharge. She feels stuffed up in her nose and has itchy eyes. Denies sore throat, fever, chills.  Review of Systems:  Constitutional: Negative for fever and diaphoresis Eyes: Negative for double vision and discharge.  Respiratory: breathing stable on o2. Negative for wheezing.   Cardiovascular: Negative for chest pain, palpitations.  Gastrointestinal: Negative for heartburn, vomiting, nausea, abdominal pain.  Genitourinary: Negative for flank pain.  Musculoskeletal: Negative for back pain Skin: Negative for itching, rash.  Neurological: Negative for dizziness Psychiatric/Behavioral: Negative for depression.    Past Medical History  Diagnosis Date  . Hypertension   . GERD (gastroesophageal reflux disease)   . Diabetes mellitus without complication (HCC)   . FTT (failure to thrive) in adult   . Depressive disorder   . Hypothyroid   . Hypoxemia   . Hypercholesteremia   . Heart failure (HCC)   . Magnesium deficiency   . Rheumatoid arthritis (HCC)   . Intellectual disability     Medications:   Medication List       This list is accurate as of: 01/27/16  3:46 PM.  Always use your most recent med list.               amLODipine 5 MG tablet    Commonly known as:  NORVASC  Take 5 mg by mouth daily.     aspirin 81 MG chewable tablet  Chew 81 mg by mouth daily.     citalopram 10 MG tablet  Commonly known as:  CELEXA  Take 10 mg by mouth daily.     Cranberry 250 MG Caps  Take 250 mg by mouth 2 (two) times daily.     estradiol 0.1 MG/GM vaginal cream  Commonly known as:  ESTRACE  Place 1 Applicatorful vaginally daily. For 1 month and then 3 times a week. Stop date 12/22/15     fentaNYL 12 MCG/HR  Commonly known as:  DURAGESIC - dosed mcg/hr  Apply one patch every 72 hours for pain. Remove old patch. External Use. Rotate sites     folic acid 1 MG tablet  Commonly known as:  FOLVITE  Take 1 mg by mouth daily.     furosemide 20 MG tablet  Commonly known as:  LASIX  Take 1 tablet (20 mg total) by mouth daily.     ipratropium-albuterol 0.5-2.5 (3) MG/3ML Soln  Commonly known as:  DUONEB  Take 3 mLs by nebulization every 6 (six) hours as needed.     levothyroxine 150 MCG tablet  Commonly known as:  SYNTHROID, LEVOTHROID  Take 150 mcg by mouth every morning.     loratadine 10 MG tablet  Commonly known as:  CLARITIN  Take 10 mg by mouth daily as needed for allergies.     losartan 100 MG tablet  Commonly known as:  COZAAR  Take 100 mg by mouth daily.     metFORMIN 1000 MG tablet  Commonly known as:  GLUCOPHAGE  Take 1,000 mg by mouth 2 (two) times daily with a meal.     methotrexate 2.5 MG tablet  Commonly known as:  RHEUMATREX  Take 2.5 mg by mouth once a week. Caution:Chemotherapy. Protect from light. Give 2 tablets= 5 mg by mouth weekly     metoprolol tartrate 25 MG tablet  Commonly known as:  LOPRESSOR  Take 1 tablet (25 mg total) by mouth 2 (two) times daily.     pantoprazole 40 MG tablet  Commonly known as:  PROTONIX  Take 40 mg by mouth daily.     PROCEL Powd  Take 1 scoop by mouth 2 (two) times daily.         Physical Exam: BP 126/79 mmHg  Pulse 94  Temp(Src) 97.4 F (36.3 C) (Oral)  Resp  16  Ht 5\' 2"  (1.575 m)  Wt 251 lb (113.853 kg)  BMI 45.90 kg/m2  SpO2 96%  Wt Readings from Last 3 Encounters:  01/27/16 251 lb (113.853 kg)  12/21/15 247 lb 9.6 oz (112.311 kg)  11/23/15 250 lb (113.399 kg)    General- adult female, obese, in no acute distress Head- normocephalic, atraumatic Nose- no maxillary or frontal sinus tenderness, clear nasal discharge Throat- moist mucus membrane  Eyes- PERRLA, EOMI, no pallor, no icterus, watery discharge Neck- no cervical lymphadenopathy Cardiovascular- normal s1,s2, no murmur Respiratory- bilateral clear to auscultation, no wheeze, no rhonchi, no crackles, no use of accessory muscles, on o2 Abdomen- bowel sounds present, soft, non tender Musculoskeletal- able to move all 4 extremities, leg edema +, on wheelchair Neurological-  alert and oriented Skin- warm and dry Psychiatry- normal mood and affect    Labs reviewed: Basic Metabolic Panel:  Recent Labs  01/21/16 0248 07/18/15 0616 07/21/15 0424 07/27/15  NA 140 142  --  143  K 4.2 3.7  --  4.5  CL 98* 98*  --   --   CO2 34* 36*  --   --   GLUCOSE 194* 123*  --   --   BUN 23* 19  --  30*  CREATININE 0.59 0.51 0.66 0.6  CALCIUM 8.7* 8.8*  --   --    Liver Function Tests:  Recent Labs  07/17/15 0248 07/27/15  AST 30 18  ALT 24 18  ALKPHOS 71 54  BILITOT 0.4  --   PROT 7.0  --   ALBUMIN 3.9  --     CBC:  Recent Labs  07/17/15 0248 07/27/15  WBC 5.4 2.7  NEUTROABS 3.9  --   HGB 12.0 11.0*  HCT 36.8 38  MCV 91.7  --   PLT 212 230   Cardiac Enzymes:  Recent Labs  07/17/15 0920 07/17/15 1137 07/17/15 1851  TROPONINI <0.03 <0.03 <0.03   Lab Results  Component Value Date   HGBA1C 5.7 07/17/2015   Lipid Panel     Component Value Date/Time   CHOL 195 11/24/2015   TRIG 105 11/24/2015   HDL 53 11/24/2015   LDLCALC 121 11/24/2015      Assessment/Plan  Allergic rhinitis With cough and runny nose, change loratadine to 10 mg daily and  monitor  Dry eyes Start artifical tears and monitor  Leg edema Stable, continue lasix 20  mg daily and advised to keep legs elevated at rest.   Recurrent UTI Currently symptom free. Continue cranberry supplement  GERD Symptom stable, continue pantoprazole 40 mg daily   Oneal Grout, MD  [facility] [phone] (Monday-Friday 8 am - 5 pm) [phone] (afterhours)

## 2016-01-28 LAB — CBC AND DIFFERENTIAL
HCT: 39 % (ref 36–46)
Hemoglobin: 11.6 g/dL — AB (ref 12.0–16.0)
Platelets: 210 10*3/uL (ref 150–399)
WBC: 2.8 10*3/mL

## 2016-01-28 LAB — MICROALBUMIN, URINE: Microalb, Ur: 3.63

## 2016-01-28 LAB — HEPATIC FUNCTION PANEL
ALT: 15 U/L (ref 7–35)
AST: 18 U/L (ref 13–35)

## 2016-02-11 LAB — BASIC METABOLIC PANEL
BUN: 21 mg/dL (ref 4–21)
Creatinine: 0.5 mg/dL (ref 0.5–1.1)
GLUCOSE: 150 mg/dL
Potassium: 3.7 mmol/L (ref 3.4–5.3)
SODIUM: 138 mmol/L (ref 137–147)

## 2016-02-11 LAB — HEPATIC FUNCTION PANEL
ALT: 17 U/L (ref 7–35)
AST: 17 U/L (ref 13–35)
Alkaline Phosphatase: 45 U/L (ref 25–125)
BILIRUBIN, TOTAL: 0.4 mg/dL

## 2016-02-11 LAB — CBC AND DIFFERENTIAL
HEMATOCRIT: 36 % (ref 36–46)
HEMOGLOBIN: 11.8 g/dL — AB (ref 12.0–16.0)
PLATELETS: 209 10*3/uL (ref 150–399)
WBC: 5.1 10*3/mL

## 2016-02-17 ENCOUNTER — Other Ambulatory Visit: Payer: Self-pay | Admitting: *Deleted

## 2016-02-17 MED ORDER — FENTANYL 12 MCG/HR TD PT72: MEDICATED_PATCH | TRANSDERMAL | Status: DC

## 2016-03-18 ENCOUNTER — Non-Acute Institutional Stay (SKILLED_NURSING_FACILITY): Payer: Medicare Other | Admitting: Internal Medicine

## 2016-03-18 ENCOUNTER — Encounter: Payer: Self-pay | Admitting: Internal Medicine

## 2016-03-18 DIAGNOSIS — B9620 Unspecified Escherichia coli [E. coli] as the cause of diseases classified elsewhere: Secondary | ICD-10-CM

## 2016-03-18 DIAGNOSIS — Z1211 Encounter for screening for malignant neoplasm of colon: Secondary | ICD-10-CM | POA: Diagnosis not present

## 2016-03-18 DIAGNOSIS — J069 Acute upper respiratory infection, unspecified: Secondary | ICD-10-CM | POA: Diagnosis not present

## 2016-03-18 DIAGNOSIS — N390 Urinary tract infection, site not specified: Secondary | ICD-10-CM | POA: Diagnosis not present

## 2016-03-18 DIAGNOSIS — Z1239 Encounter for other screening for malignant neoplasm of breast: Secondary | ICD-10-CM

## 2016-03-18 NOTE — Progress Notes (Signed)
Patient ID: Meghan Patterson, female   DOB: 1961/06/03, 55 y.o.   MRN: [id number]     [facility]   PCP: FISCHER, TIMOTHY LEE, DO  Code Status: Full Code  Allergies  Allergen Reactions  . Bee Venom     Chief Complaint  Patient presents with  . Medical Management of Chronic Issues    Routine Visit    Advanced Directives 09/21/2015  Does patient have an advance directive? Yes  Type of Advance Directive Healthcare Power of Attorney  Does patient want to make changes to advanced directive? No - Patient declined  Copy of advanced directive(s) in chart? No - copy requested     HPI:  55 y.o. patient is seen for routine visit. She complaints of cough with runny nose and sore throat x1 day. Denies fever, chills, earache, headache, nausea or vomiting. She was recently treated for e.coli UTI.   Review of Systems:  Constitutional: Negative for fever and diaphoresis Eyes: Negative for double vision and discharge.  Respiratory: breathing stable on o2. Negative for wheezing.   Cardiovascular: Negative for chest pain, palpitations.  Gastrointestinal: Negative for heartburn, vomiting, nausea, abdominal pain.  Genitourinary: Negative for flank pain.  Musculoskeletal: Negative for back pain Skin: Negative for itching, rash.  Neurological: Negative for dizziness Psychiatric/Behavioral: Negative for depression.    Past Medical History  Diagnosis Date  . Hypertension   . GERD (gastroesophageal reflux disease)   . Diabetes mellitus without complication (HCC)   . FTT (failure to thrive) in adult   . Depressive disorder   . Hypothyroid   . Hypoxemia   . Hypercholesteremia   . Heart failure (HCC)   . Magnesium deficiency   . Rheumatoid arthritis (HCC)   . Intellectual disability     Medications:   Medication List       This list is accurate as of: 03/18/16  3:17 PM.  Always use your most recent med list.               acetaminophen 325 MG  tablet  Commonly known as:  TYLENOL  Take 650 mg by mouth every 6 (six) hours as needed for fever or headache (or muscle pain).     amLODipine 5 MG tablet  Commonly known as:  NORVASC  Take 5 mg by mouth daily.     aspirin 81 MG chewable tablet  Chew 81 mg by mouth daily.     citalopram 10 MG tablet  Commonly known as:  CELEXA  Take 10 mg by mouth daily.     Cranberry 250 MG Caps  Take 250 mg by mouth 2 (two) times daily.     estradiol 0.1 MG/GM vaginal cream  Commonly known as:  ESTRACE  Place 1 Applicatorful vaginally daily. For 1 month and then 3 times a week. Stop date 12/22/15     fentaNYL 12 MCG/HR  Commonly known as:  DURAGESIC - dosed mcg/hr  Apply one patch every 72 hours for pain. Remove old patch. External Use. Rotate sites     folic acid 1 MG tablet  Commonly known as:  FOLVITE  Take 1 mg by mouth daily.     furosemide 20 MG tablet  Commonly known as:  LASIX  Take 1 tablet (20 mg total) by mouth daily.     ipratropium-albuterol 0.5-2.5 (3) MG/3ML Soln  Commonly known as:  DUONEB  Take 3 mLs by nebulization every 6 (six) hours as needed.     levothyroxine  150 MCG tablet  Commonly known as:  SYNTHROID, LEVOTHROID  Take 150 mcg by mouth every morning.     loratadine 10 MG tablet  Commonly known as:  CLARITIN  Take 10 mg by mouth daily.     losartan 100 MG tablet  Commonly known as:  COZAAR  Take 100 mg by mouth daily.     metFORMIN 1000 MG tablet  Commonly known as:  GLUCOPHAGE  Take 1,000 mg by mouth 2 (two) times daily with a meal.     methotrexate 2.5 MG tablet  Commonly known as:  RHEUMATREX  Take 2.5 mg by mouth once a week. Caution:Chemotherapy. Protect from light. Give 2 tablets= 5 mg by mouth weekly     metoprolol tartrate 25 MG tablet  Commonly known as:  LOPRESSOR  Take 1 tablet (25 mg total) by mouth 2 (two) times daily.     pantoprazole 40 MG tablet  Commonly known as:  PROTONIX  Take 40 mg by mouth daily.     PROCEL Powd  Take 1  scoop by mouth 2 (two) times daily.         Physical Exam: BP 140/78 mmHg  Pulse 77  Temp(Src) 98.1 F (36.7 C) (Oral)  Resp 20  Ht 5\' 2"  (1.575 m)  Wt 253 lb 6.4 oz (114.941 kg)  BMI 46.34 kg/m2  SpO2 95%  Wt Readings from Last 3 Encounters:  03/18/16 253 lb 6.4 oz (114.941 kg)  01/27/16 251 lb (113.853 kg)  12/21/15 247 lb 9.6 oz (112.311 kg)    General- adult female, obese, in no acute distress Head- normocephalic, atraumatic Nose- no maxillary or frontal sinus tenderness, clear nasal discharge Throat- moist mucus membrane, no oropharyngeal erythema Eyes- PERRLA, EOMI, no pallor, no icterus Neck- no cervical lymphadenopathy Cardiovascular- normal s1,s2, no murmur Respiratory- bilateral clear to auscultation, no wheeze, no rhonchi, no crackles, no use of accessory muscles, on o2 Abdomen- bowel sounds present, soft, non tender Musculoskeletal- able to move all 4 extremities, leg edema +, on wheelchair Neurological-  alert and oriented Skin- warm and dry Psychiatry- normal mood and affect    Labs reviewed: Basic Metabolic Panel:  Recent Labs  02/20/16 0248 07/18/15 0616 07/21/15 0424 07/27/15 02/11/16  NA 140 142  --  143 138  K 4.2 3.7  --  4.5 3.7  CL 98* 98*  --   --   --   CO2 34* 36*  --   --   --   GLUCOSE 194* 123*  --   --   --   BUN 23* 19  --  30* 21  CREATININE 0.59 0.51 0.66 0.6 0.5  CALCIUM 8.7* 8.8*  --   --   --    Liver Function Tests:  Recent Labs  07/17/15 0248 07/27/15 01/28/16 02/11/16  AST 30 18 18 17   ALT 24 18 15 17   ALKPHOS 71 54  --  45  BILITOT 0.4  --   --   --   PROT 7.0  --   --   --   ALBUMIN 3.9  --   --   --     CBC:  Recent Labs  07/17/15 0248 07/27/15 01/28/16 02/11/16  WBC 5.4 2.7 2.8 5.1  NEUTROABS 3.9  --   --   --   HGB 12.0 11.0* 11.6* 11.8*  HCT 36.8 38 39 36  MCV 91.7  --   --   --   PLT 212 230 210 209  Cardiac Enzymes:  Recent Labs  07/17/15 0920 07/17/15 1137 07/17/15 1851  TROPONINI  <0.03 <0.03 <0.03   Lab Results  Component Value Date   HGBA1C 5.7 07/17/2015   Lipid Panel     Component Value Date/Time   CHOL 195 11/24/2015   TRIG 105 11/24/2015   HDL 53 11/24/2015   LDLCALC 121 11/24/2015      Assessment/Plan  Upper respiratory tract infection Likely viral URI. Treat symptomatically for now. Start robitussin 5 cc tid x 5 days and cepacol losenges for sore throat. Monitor for fever. Hydration to be maintained  E.coli uti Completed her levaquin course. Monitor clinically  Colon cancer screen FOBT X 3  Breast cancer screen Schedule mammogram and monitor   Oneal Grout, MD Internal Medicine [facility] [address] [place], [place] [address] Cell Phone (Monday-Friday 8 am - 5 pm): [phone] On Call: [phone] and follow prompts after 5 pm and on weekends Office Phone: [phone] Office Fax: [phone]

## 2016-03-22 ENCOUNTER — Other Ambulatory Visit: Payer: Self-pay | Admitting: Internal Medicine

## 2016-03-22 DIAGNOSIS — Z1231 Encounter for screening mammogram for malignant neoplasm of breast: Secondary | ICD-10-CM

## 2016-03-30 ENCOUNTER — Ambulatory Visit
Admission: RE | Admit: 2016-03-30 | Discharge: 2016-03-30 | Disposition: A | Discharge status: Home / Self Care | Payer: Medicare Other | Source: Ambulatory Visit | Attending: Internal Medicine | Admitting: Internal Medicine

## 2016-03-30 DIAGNOSIS — Z1231 Encounter for screening mammogram for malignant neoplasm of breast: Secondary | ICD-10-CM

## 2016-04-15 LAB — CBC AND DIFFERENTIAL
HEMATOCRIT: 37 % (ref 36–46)
HEMOGLOBIN: 11.4 g/dL — AB (ref 12.0–16.0)
PLATELETS: 194 10*3/uL (ref 150–399)
WBC: 3.1 10^3/mL

## 2016-04-15 LAB — HEPATIC FUNCTION PANEL
ALK PHOS: 44 U/L (ref 25–125)
ALT: 13 U/L (ref 7–35)
AST: 16 U/L (ref 13–35)
BILIRUBIN, TOTAL: 0.4 mg/dL

## 2016-04-15 LAB — BASIC METABOLIC PANEL
BUN: 21 mg/dL (ref 4–21)
Creatinine: 0.5 mg/dL (ref 0.5–1.1)
Glucose: 128 mg/dL
Potassium: 3.8 mmol/L (ref 3.4–5.3)
SODIUM: 138 mmol/L (ref 137–147)

## 2016-04-18 LAB — HEMOGLOBIN A1C: HEMOGLOBIN A1C: 5.8

## 2016-04-26 ENCOUNTER — Non-Acute Institutional Stay (SKILLED_NURSING_FACILITY): Payer: Medicare Other | Admitting: Internal Medicine

## 2016-04-26 ENCOUNTER — Encounter: Payer: Self-pay | Admitting: Internal Medicine

## 2016-04-26 DIAGNOSIS — J189 Pneumonia, unspecified organism: Secondary | ICD-10-CM | POA: Diagnosis not present

## 2016-04-26 DIAGNOSIS — J449 Chronic obstructive pulmonary disease, unspecified: Secondary | ICD-10-CM

## 2016-04-26 DIAGNOSIS — J309 Allergic rhinitis, unspecified: Secondary | ICD-10-CM | POA: Diagnosis not present

## 2016-04-26 DIAGNOSIS — E118 Type 2 diabetes mellitus with unspecified complications: Secondary | ICD-10-CM | POA: Diagnosis not present

## 2016-04-26 NOTE — Progress Notes (Signed)
Patient ID: Meghan Patterson, female   DOB: 1961/06/15, 55 y.o.   MRN: [id number]     [facility]   PCP: FISCHER, TIMOTHY LEE, DO  Code Status: Full Code  Allergies  Allergen Reactions  . Bee Venom     Chief Complaint  Patient presents with  . Medical Management of Chronic Issues    Routine Visit    Advanced Directives 09/21/2015  Does patient have an advance directive? Yes  Type of Advance Directive Healthcare Power of Attorney  Does patient want to make changes to advanced directive? No - Patient declined  Copy of advanced directive(s) in chart? No - copy requested     HPI:  55 y.o. patient is seen for routine visit. She continues to have dry cough with clear nasal discharge and post nasal drip. No other concerns. Has been at her baseline otherwise. No fall reported.  She recently completed a course of antibiotic for presumed pneumonia. No chest xray available for review.   Review of Systems:  Constitutional: Negative for fever Eyes: Negative for double vision and discharge. Due for eye exam  Respiratory: breathing stable on o2. Negative for wheezing.   Cardiovascular: Negative for chest pain, palpitations.  Gastrointestinal: Negative for heartburn, vomiting, nausea, abdominal pain. Had bowel movement this am Genitourinary: Negative for flank pain.  Musculoskeletal: Negative for back pain Skin: Negative for itching, rash.  Neurological: Negative for dizziness Psychiatric/Behavioral: Negative for depression.    Past Medical History  Diagnosis Date  . Hypertension   . GERD (gastroesophageal reflux disease)   . Diabetes mellitus without complication (HCC)   . FTT (failure to thrive) in adult   . Depressive disorder   . Hypothyroid   . Hypoxemia   . Hypercholesteremia   . Heart failure (HCC)   . Magnesium deficiency   . Rheumatoid arthritis (HCC)   . Intellectual disability     Medications:   Medication List       This  list is accurate as of: 04/26/16  3:37 PM.  Always use your most recent med list.               acetaminophen 325 MG tablet  Commonly known as:  TYLENOL  Take 650 mg by mouth every 4 (four) hours as needed for fever or headache (or muscle pain).     amLODipine 5 MG tablet  Commonly known as:  NORVASC  Take 5 mg by mouth daily.     aspirin 81 MG chewable tablet  Chew 81 mg by mouth daily.     citalopram 10 MG tablet  Commonly known as:  CELEXA  Take 10 mg by mouth daily.     Cranberry 250 MG Caps  Take 250 mg by mouth 2 (two) times daily.     estradiol 0.1 MG/GM vaginal cream  Commonly known as:  ESTRACE  Place 1 Applicatorful vaginally daily. For 1 month and then 3 times a week.     fentaNYL 12 MCG/HR  Commonly known as:  DURAGESIC - dosed mcg/hr  Apply one patch every 72 hours for pain. Remove old patch. External Use. Rotate sites     folic acid 1 MG tablet  Commonly known as:  FOLVITE  Take 1 mg by mouth daily.     furosemide 20 MG tablet  Commonly known as:  LASIX  Take 1 tablet (20 mg total) by mouth daily.     ipratropium-albuterol 0.5-2.5 (3) MG/3ML Soln  Commonly known as:  DUONEB  Take 3 mLs by nebulization every 6 (six) hours as needed.     levothyroxine 150 MCG tablet  Commonly known as:  SYNTHROID, LEVOTHROID  Take 150 mcg by mouth every morning.     loratadine 10 MG tablet  Commonly known as:  CLARITIN  Take 10 mg by mouth daily.     losartan 100 MG tablet  Commonly known as:  COZAAR  Take 100 mg by mouth daily.     menthol-cetylpyridinium 3 MG lozenge  Commonly known as:  CEPACOL  Take 1 lozenge by mouth every 6 (six) hours as needed for sore throat.     metFORMIN 1000 MG tablet  Commonly known as:  GLUCOPHAGE  Take 1,000 mg by mouth 2 (two) times daily with a meal.     methotrexate 2.5 MG tablet  Commonly known as:  RHEUMATREX  Take 2.5 mg by mouth once a week. Caution:Chemotherapy. Protect from light. Give 2 tablets= 5 mg by mouth weekly      metoprolol tartrate 25 MG tablet  Commonly known as:  LOPRESSOR  Take 1 tablet (25 mg total) by mouth 2 (two) times daily.     pantoprazole 40 MG tablet  Commonly known as:  PROTONIX  Take 40 mg by mouth daily.     PROCEL Powd  Take 1 scoop by mouth 2 (two) times daily.         Physical Exam: BP 136/76 mmHg  Pulse 77  Temp(Src) 97.1 F (36.2 C) (Oral)  Resp 20  Ht 5\' 2"  (1.575 m)  Wt 262 lb 3.2 oz (118.933 kg)  BMI 47.94 kg/m2  SpO2 95%  Wt Readings from Last 3 Encounters:  04/26/16 262 lb 3.2 oz (118.933 kg)  03/18/16 253 lb 6.4 oz (114.941 kg)  01/27/16 251 lb (113.853 kg)    General- adult female, obese, in no acute distress Head- normocephalic, atraumatic Nose- no maxillary or frontal sinus tenderness, clear nasal discharge Throat- moist mucus membrane  Eyes- PERRLA, EOMI, no pallor, no icterus Neck- no cervical lymphadenopathy Cardiovascular- normal s1,s2, no murmur Respiratory- bilateral clear to auscultation, no wheeze, no rhonchi, no crackles, no use of accessory muscles, on o2 Abdomen- bowel sounds present, soft, non tender Musculoskeletal- able to move all 4 extremities, leg edema +, on wheelchair Neurological-  alert and oriented Skin- warm and dry Psychiatry- normal mood and affect    Labs reviewed: Basic Metabolic Panel:  Recent Labs  03/28/16 0248 07/18/15 0616  07/27/15 02/11/16 04/15/16  NA 140 142  --  143 138 138  K 4.2 3.7  --  4.5 3.7 3.8  CL 98* 98*  --   --   --   --   CO2 34* 36*  --   --   --   --   GLUCOSE 194* 123*  --   --   --   --   BUN 23* 19  --  30* 21 21  CREATININE 0.59 0.51  < > 0.6 0.5 0.5  CALCIUM 8.7* 8.8*  --   --   --   --   < > = values in this interval not displayed. Liver Function Tests:  Recent Labs  07/17/15 0248 07/27/15 01/28/16 02/11/16 04/15/16  AST 30 18 18 17 16   ALT 24 18 15 17 13   ALKPHOS 71 54  --  45 44  BILITOT 0.4  --   --   --   --   PROT 7.0  --   --   --   --  ALBUMIN 3.9  --    --   --   --     CBC:  Recent Labs  07/17/15 0248  01/28/16 02/11/16 04/15/16  WBC 5.4  < > 2.8 5.1 3.1  NEUTROABS 3.9  --   --   --   --   HGB 12.0  < > 11.6* 11.8* 11.4*  HCT 36.8  < > 39 36 37  MCV 91.7  --   --   --   --   PLT 212  < > 210 209 194  < > = values in this interval not displayed. Cardiac Enzymes:  Recent Labs  07/17/15 0920 07/17/15 1137 07/17/15 1851  TROPONINI <0.03 <0.03 <0.03   Lab Results  Component Value Date   HGBA1C 5.8 04/18/2016   Lipid Panel     Component Value Date/Time   CHOL 195 11/24/2015   TRIG 105 11/24/2015   HDL 53 11/24/2015   LDLCALC 121 11/24/2015      Assessment/Plan  Allergic rhinitis continue loratadine 10 mg daily with prn albuterol and monitor. Add singulair 10 mg daily and monitor  Pneumonia Has completed 5 days course of levaquin. Monitor clinically. Pulmonary toleiting to be provided  Type 2 DM Lab Results  Component Value Date   HGBA1C 5.8 04/18/2016   Monitor cbg. Continue metformin 1000 mg bid with losartan and aspirin  COPD Stable breathing. Continue o2. Continue duoneb q6h prn dyspnea. Monitor for copd exacerbation   Oneal Grout, MD  [facility] Adult Medicine [phone] (Monday-Friday 8 am - 5 pm) [phone] (afterhours)

## 2016-06-02 LAB — TSH: TSH: 2.16 u[IU]/mL (ref 0.41–5.90)

## 2016-06-15 ENCOUNTER — Non-Acute Institutional Stay (SKILLED_NURSING_FACILITY): Payer: Medicare Other | Admitting: Internal Medicine

## 2016-06-15 ENCOUNTER — Encounter: Payer: Self-pay | Admitting: Internal Medicine

## 2016-06-15 DIAGNOSIS — I10 Essential (primary) hypertension: Secondary | ICD-10-CM

## 2016-06-15 DIAGNOSIS — H04123 Dry eye syndrome of bilateral lacrimal glands: Secondary | ICD-10-CM | POA: Insufficient documentation

## 2016-06-15 DIAGNOSIS — E039 Hypothyroidism, unspecified: Secondary | ICD-10-CM

## 2016-06-15 DIAGNOSIS — E559 Vitamin D deficiency, unspecified: Secondary | ICD-10-CM | POA: Diagnosis not present

## 2016-06-15 NOTE — Progress Notes (Signed)
Patient ID: Meghan Patterson, female   DOB: 02/26/1961, 55 y.o.   MRN: [id number]     [facility]   PCP: FISCHER, TIMOTHY LEE, DO  Code Status: Full Code  Allergies  Allergen Reactions  . Bee Venom     Chief Complaint  Patient presents with  . Medical Management of Chronic Issues    Routine Visit    Advanced Directives 09/21/2015  Does patient have an advance directive? Yes  Type of Advance Directive Healthcare Power of Attorney  Does patient want to make changes to advanced directive? No - Patient declined  Copy of advanced directive(s) in chart? No - copy requested     HPI:  55 y.o. patient is seen for routine visit. She complaints of right knee pain bothering her. Denies any other concern today. She was seen by ophthalmology yesterday. She has been placed on eye drop for dry eye.    Review of Systems:  Constitutional: Negative for fever Eyes: Negative for double vision and discharge. Respiratory: breathing stable on o2. Negative for wheezing.   Cardiovascular: Negative for chest pain, palpitations.  Gastrointestinal: Negative for heartburn, vomiting, nausea, abdominal pain.  Genitourinary: Negative for flank pain.  Skin: Negative for itching, rash.  Neurological: Negative for dizziness Psychiatric/Behavioral: Negative for depression.    Past Medical History:  Diagnosis Date  . Depressive disorder   . Diabetes mellitus without complication (HCC)   . FTT (failure to thrive) in adult   . GERD (gastroesophageal reflux disease)   . Heart failure (HCC)   . Hypercholesteremia   . Hypertension   . Hypothyroid   . Hypoxemia   . Intellectual disability   . Magnesium deficiency   . Rheumatoid arthritis (HCC)     Medications:   Medication List       Accurate as of 06/15/16 11:55 AM. Always use your most recent med list.          acetaminophen 325 MG tablet Commonly known as:  TYLENOL Take 650 mg by mouth every 4 (four) hours  as needed for fever or headache (or muscle pain).   amLODipine 5 MG tablet Commonly known as:  NORVASC Take 5 mg by mouth daily.   aspirin 81 MG chewable tablet Chew 81 mg by mouth daily.   citalopram 10 MG tablet Commonly known as:  CELEXA Take 10 mg by mouth daily.   Cranberry 250 MG Caps Take 250 mg by mouth 2 (two) times daily.   estradiol 0.1 MG/GM vaginal cream Commonly known as:  ESTRACE Place 1 Applicatorful vaginally daily. For 1 month and then 3 times a week.   fentaNYL 12 MCG/HR Commonly known as:  DURAGESIC - dosed mcg/hr Apply one patch every 72 hours for pain. Remove old patch. External Use. Rotate sites   folic acid 1 MG tablet Commonly known as:  FOLVITE Take 1 mg by mouth daily.   furosemide 20 MG tablet Commonly known as:  LASIX Take 1 tablet (20 mg total) by mouth daily.   ipratropium-albuterol 0.5-2.5 (3) MG/3ML Soln Commonly known as:  DUONEB Take 3 mLs by nebulization every 6 (six) hours as needed.   levothyroxine 150 MCG tablet Commonly known as:  SYNTHROID, LEVOTHROID Take 150 mcg by mouth every morning.   loratadine 10 MG tablet Commonly known as:  CLARITIN Take 10 mg by mouth daily.   losartan 100 MG tablet Commonly known as:  COZAAR Take 100 mg by mouth daily.   menthol-cetylpyridinium 3 MG lozenge  Commonly known as:  CEPACOL Take 1 lozenge by mouth every 6 (six) hours as needed for sore throat.   metFORMIN 1000 MG tablet Commonly known as:  GLUCOPHAGE Take 1,000 mg by mouth 2 (two) times daily with a meal.   methotrexate 2.5 MG tablet Commonly known as:  RHEUMATREX Take 2.5 mg by mouth once a week. Caution:Chemotherapy. Protect from light. Give 2 tablets= 5 mg by mouth weekly   metoprolol tartrate 25 MG tablet Commonly known as:  LOPRESSOR Take 1 tablet (25 mg total) by mouth 2 (two) times daily.   montelukast 10 MG tablet Commonly known as:  SINGULAIR Take 10 mg by mouth at bedtime.   pantoprazole 40 MG tablet Commonly  known as:  PROTONIX Take 40 mg by mouth daily.   PROCEL Powd Take 1 scoop by mouth 2 (two) times daily.   SYSTANE BALANCE 0.6 % Soln Generic drug:  Propylene Glycol Apply 1 drop to eye 2 (two) times daily.        Physical Exam: BP (!) 145/74   Pulse 60   Temp 97.2 F (36.2 C) (Oral)   Resp 20   Ht 5\' 2"  (1.575 m)   Wt 257 lb 6.4 oz (116.8 kg)   SpO2 98%   BMI 47.08 kg/m   Wt Readings from Last 3 Encounters:  06/15/16 257 lb 6.4 oz (116.8 kg)  04/26/16 262 lb 3.2 oz (118.9 kg)  03/18/16 253 lb 6.4 oz (114.9 kg)    General- adult female, morbidly obese, in no acute distress Head- normocephalic, atraumatic Nose- no nasal discharge Throat- moist mucus membrane  Eyes- PERRLA, EOMI, no pallor, no icterus Neck- no cervical lymphadenopathy Cardiovascular- normal s1,s2, no murmur Respiratory- bilateral clear to auscultation, no wheeze, no rhonchi, no crackles, no use of accessory muscles, on o2 Abdomen- bowel sounds present, soft, non tender Musculoskeletal- able to move all 4 extremities with limited ROM, bilateral knee limited ROM, leg edema +, on wheelchair Neurological-  alert and oriented Skin- warm and dry Psychiatry- normal mood and affect    Labs reviewed: Basic Metabolic Panel:  Recent Labs  05/18/16 0248 07/18/15 0616  07/27/15 02/11/16 04/15/16  NA 140 142  --  143 138 138  K 4.2 3.7  --  4.5 3.7 3.8  CL 98* 98*  --   --   --   --   CO2 34* 36*  --   --   --   --   GLUCOSE 194* 123*  --   --   --   --   BUN 23* 19  --  30* 21 21  CREATININE 0.59 0.51  < > 0.6 0.5 0.5  CALCIUM 8.7* 8.8*  --   --   --   --   < > = values in this interval not displayed. Liver Function Tests:  Recent Labs  07/17/15 0248 07/27/15 01/28/16 02/11/16 04/15/16  AST 30 18 18 17 16   ALT 24 18 15 17 13   ALKPHOS 71 54  --  45 44  BILITOT 0.4  --   --   --   --   PROT 7.0  --   --   --   --   ALBUMIN 3.9  --   --   --   --     CBC:  Recent Labs  07/17/15 0248   01/28/16 02/11/16 04/15/16  WBC 5.4  < > 2.8 5.1 3.1  NEUTROABS 3.9  --   --   --   --  HGB 12.0  < > 11.6* 11.8* 11.4*  HCT 36.8  < > 39 36 37  MCV 91.7  --   --   --   --   PLT 212  < > 210 209 194  < > = values in this interval not displayed. Cardiac Enzymes:  Recent Labs  07/17/15 0920 07/17/15 1137 07/17/15 1851  TROPONINI <0.03 <0.03 <0.03   Lab Results  Component Value Date   HGBA1C 5.8 04/18/2016   Lipid Panel     Component Value Date/Time   CHOL 195 11/24/2015   TRIG 105 11/24/2015   HDL 53 11/24/2015   LDLCALC 121 11/24/2015   Lab Results  Component Value Date   HGBA1C 5.8 04/18/2016   06/02/16 vitamin d 27.65  Lab Results  Component Value Date   TSH 2.16 06/02/2016     Mammogram Digital Screening Bilateral  03/30/16 Impression. No mammographic evidence of malignancy.     Assessment/Plan  Dry eyes Started on systane bid, monitor, pending ophthalmology note for review  Hypertension Overall stable reading, continue  Metoprolol tartrate 25 mg bid with losartan 100 mg daily and norvasc 5 mg daily. Monitor BP  Hypothyroidism Lab Results  Component Value Date   TSH 2.16 06/02/2016   Continue levothyroxine 150 mcg daily  Vitamin d def Start vitamin d 50,000 iu once a week for 12 weeks and then 2000 u daily. Check vitamin d level in 3 months    Oneal Grout, MD Internal Medicine [facility] [address] [place], [place] [address] Cell Phone (Monday-Friday 8 am - 5 pm): [phone] On Call: [phone] and follow prompts after 5 pm and on weekends Office Phone: [phone] Office Fax: [phone]

## 2016-06-21 LAB — HEMOGLOBIN A1C: HEMOGLOBIN A1C: 6

## 2016-07-14 ENCOUNTER — Non-Acute Institutional Stay (SKILLED_NURSING_FACILITY): Payer: Medicare Other | Admitting: Internal Medicine

## 2016-07-14 ENCOUNTER — Encounter: Payer: Self-pay | Admitting: Internal Medicine

## 2016-07-14 DIAGNOSIS — F329 Major depressive disorder, single episode, unspecified: Secondary | ICD-10-CM

## 2016-07-14 DIAGNOSIS — I739 Peripheral vascular disease, unspecified: Secondary | ICD-10-CM

## 2016-07-14 DIAGNOSIS — M1711 Unilateral primary osteoarthritis, right knee: Secondary | ICD-10-CM

## 2016-07-14 DIAGNOSIS — F71 Moderate intellectual disabilities: Secondary | ICD-10-CM | POA: Insufficient documentation

## 2016-07-14 DIAGNOSIS — K219 Gastro-esophageal reflux disease without esophagitis: Secondary | ICD-10-CM | POA: Diagnosis not present

## 2016-07-14 NOTE — Progress Notes (Signed)
Patient ID: Meghan Patterson, female   DOB: [birth year], 55 y.o.   MRN: [id number]     [facility]   PCP: FISCHER, TIMOTHY LEE, DO  Code Status: Full Code  Allergies  Allergen Reactions  . Bee Venom     Chief Complaint  Patient presents with  . Medical Management of Chronic Issues    Routine Visit    Advanced Directives 09/21/2015  Does patient have an advance directive? Yes  Type of Advance Directive Healthcare Power of Attorney  Does patient want to make changes to advanced directive? No - Patient declined  Copy of advanced directive(s) in chart? No - copy requested     HPI:  55 y.o. patient is seen for routine visit. She continues to complain of right knee pain bothering her. Denies any other concern today.    Review of Systems:  Constitutional: Negative for fever Eyes: Negative for double vision and discharge. Respiratory: breathing stable on o2. Negative for wheezing.   Cardiovascular: Negative for chest pain, palpitations.  Gastrointestinal: Negative for heartburn, vomiting, nausea, abdominal pain.  Genitourinary: Negative for flank pain.  Skin: Negative for itching, rash.  Neurological: Negative for dizziness Psychiatric/Behavioral: Negative for depression.    Past Medical History:  Diagnosis Date  . Depressive disorder   . Diabetes mellitus without complication (HCC)   . FTT (failure to thrive) in adult   . GERD (gastroesophageal reflux disease)   . Heart failure (HCC)   . Hypercholesteremia   . Hypertension   . Hypothyroid   . Hypoxemia   . Intellectual disability   . Magnesium deficiency   . Rheumatoid arthritis (HCC)     Medications:   Medication List       Accurate as of 07/14/16  1:08 PM. Always use your most recent med list.          acetaminophen 325 MG tablet Commonly known as:  TYLENOL Take 650 mg by mouth every 4 (four) hours as needed for fever or headache (or muscle pain).   amLODipine 5 MG  tablet Commonly known as:  NORVASC Take 5 mg by mouth daily.   aspirin 81 MG chewable tablet Chew 81 mg by mouth daily.   Cholecalciferol 50000 units capsule Take 50,000 Units by mouth once a week. On Thursday. Stop date 09/08/16   Vitamin D3 2000 units Tabs Take 1 tablet by mouth daily.   citalopram 10 MG tablet Commonly known as:  CELEXA Take 10 mg by mouth daily.   Cranberry 250 MG Caps Take 250 mg by mouth 2 (two) times daily.   estradiol 0.1 MG/GM vaginal cream Commonly known as:  ESTRACE Place 1 Applicatorful vaginally daily. For 1 month and then 3 times a week.   fentaNYL 12 MCG/HR Commonly known as:  DURAGESIC - dosed mcg/hr Apply one patch every 72 hours for pain. Remove old patch. External Use. Rotate sites   folic acid 1 MG tablet Commonly known as:  FOLVITE Take 1 mg by mouth daily.   furosemide 20 MG tablet Commonly known as:  LASIX Take 1 tablet (20 mg total) by mouth daily.   ipratropium-albuterol 0.5-2.5 (3) MG/3ML Soln Commonly known as:  DUONEB Take 3 mLs by nebulization every 6 (six) hours as needed.   levothyroxine 150 MCG tablet Commonly known as:  SYNTHROID, LEVOTHROID Take 150 mcg by mouth every morning.   loratadine 10 MG tablet Commonly known as:  CLARITIN Take 10 mg by mouth daily.   losartan 100  MG tablet Commonly known as:  COZAAR Take 100 mg by mouth daily.   menthol-cetylpyridinium 3 MG lozenge Commonly known as:  CEPACOL Take 1 lozenge by mouth every 6 (six) hours as needed for sore throat.   metFORMIN 1000 MG tablet Commonly known as:  GLUCOPHAGE Take 1,000 mg by mouth 2 (two) times daily with a meal.   methotrexate 2.5 MG tablet Commonly known as:  RHEUMATREX Take 2.5 mg by mouth once a week. Caution:Chemotherapy. Protect from light. Give 2 tablets= 5 mg by mouth weekly   metoprolol tartrate 25 MG tablet Commonly known as:  LOPRESSOR Take 1 tablet (25 mg total) by mouth 2 (two) times daily.   montelukast 10 MG  tablet Commonly known as:  SINGULAIR Take 10 mg by mouth at bedtime.   pantoprazole 40 MG tablet Commonly known as:  PROTONIX Take 40 mg by mouth daily.   PROCEL Powd Take 1 scoop by mouth 2 (two) times daily.   SYSTANE BALANCE 0.6 % Soln Generic drug:  Propylene Glycol Apply 1 drop to eye 2 (two) times daily.        Physical Exam: BP 123/72   Pulse 78   Temp 98.4 F (36.9 C) (Oral)   Resp 20   Ht 5\' 2"  (1.575 m)   Wt 271 lb (122.9 kg)   SpO2 95%   BMI 49.57 kg/m   Wt Readings from Last 3 Encounters:  07/14/16 271 lb (122.9 kg)  06/15/16 257 lb 6.4 oz (116.8 kg)  04/26/16 262 lb 3.2 oz (118.9 kg)    General- adult female, morbidly obese, in no acute distress Head- normocephalic, atraumatic Nose- no nasal discharge Throat- moist mucus membrane  Eyes- PERRLA, EOMI, no pallor, no icterus Neck- no cervical lymphadenopathy Cardiovascular- normal s1,s2, no murmur Respiratory- bilateral clear to auscultation, no wheeze, no rhonchi, no crackles, no use of accessory muscles, on o2 Abdomen- bowel sounds present, soft, non tender Musculoskeletal- able to move all 4 extremities with limited ROM, bilateral knee limited ROM, leg edema +, on wheelchair, no erythema or joint swelling noted Neurological-  alert and oriented Skin- warm and dry Psychiatry- normal mood and affect    Labs reviewed: Basic Metabolic Panel:  Recent Labs  60/73/71 0248 07/18/15 0616  07/27/15 02/11/16 04/15/16  NA 140 142  --  143 138 138  K 4.2 3.7  --  4.5 3.7 3.8  CL 98* 98*  --   --   --   --   CO2 34* 36*  --   --   --   --   GLUCOSE 194* 123*  --   --   --   --   BUN 23* 19  --  30* 21 21  CREATININE 0.59 0.51  < > 0.6 0.5 0.5  CALCIUM 8.7* 8.8*  --   --   --   --   < > = values in this interval not displayed. Liver Function Tests:  Recent Labs  07/17/15 0248 07/27/15 01/28/16 02/11/16 04/15/16  AST 30 18 18 17 16   ALT 24 18 15 17 13   ALKPHOS 71 54  --  45 44  BILITOT 0.4  --    --   --   --   PROT 7.0  --   --   --   --   ALBUMIN 3.9  --   --   --   --     CBC:  Recent Labs  07/17/15 0248  01/28/16 02/11/16 04/15/16  WBC 5.4  < > 2.8 5.1 3.1  NEUTROABS 3.9  --   --   --   --   HGB 12.0  < > 11.6* 11.8* 11.4*  HCT 36.8  < > 39 36 37  MCV 91.7  --   --   --   --   PLT 212  < > 210 209 194  < > = values in this interval not displayed. Cardiac Enzymes:  Recent Labs  07/17/15 0920 07/17/15 1137 07/17/15 1851  TROPONINI <0.03 <0.03 <0.03   Lab Results  Component Value Date   HGBA1C 6.0 06/21/2016   Lipid Panel     Component Value Date/Time   CHOL 195 11/24/2015   TRIG 105 11/24/2015   HDL 53 11/24/2015   LDLCALC 121 11/24/2015   Lab Results  Component Value Date   HGBA1C 6.0 06/21/2016   06/02/16 vitamin d 27.65  Lab Results  Component Value Date   TSH 2.16 06/02/2016     Mammogram Digital Screening Bilateral  03/30/16 Impression. No mammographic evidence of malignancy.     Assessment/Plan  Knee OA On tylenol 650 mg q4h prn pain. Change this to tylenol extra strength 500 mg 2 tab bid with tylenol 650 mg q8h prn pain and monitor. Reassess if no improvement  Chronic major depression Continue celexa for now and monitor her mood, psych to follow as well  gerd Stable on protonix, no changes made  PVD Stable, no wound or ulcer, continue skin care and baby aspirin  Moderate intellectual disabilites Assistance with her ADLs to be provided. Fall precaution and pressure ulcer prophylaxis. Encourage to participate in group activities      Oneal Grout, MD Internal Medicine [facility] [address] [place], [place] [address] Cell Phone (Monday-Friday 8 am - 5 pm): [phone] On Call: [phone] and follow prompts after 5 pm and on weekends Office Phone: [phone] Office Fax: [phone]

## 2016-08-10 ENCOUNTER — Encounter: Payer: Self-pay | Admitting: Internal Medicine

## 2016-08-10 ENCOUNTER — Non-Acute Institutional Stay (SKILLED_NURSING_FACILITY): Payer: Medicare Other | Admitting: Internal Medicine

## 2016-08-10 DIAGNOSIS — E538 Deficiency of other specified B group vitamins: Secondary | ICD-10-CM

## 2016-08-10 DIAGNOSIS — E1151 Type 2 diabetes mellitus with diabetic peripheral angiopathy without gangrene: Secondary | ICD-10-CM

## 2016-08-10 DIAGNOSIS — M069 Rheumatoid arthritis, unspecified: Secondary | ICD-10-CM

## 2016-08-10 DIAGNOSIS — K219 Gastro-esophageal reflux disease without esophagitis: Secondary | ICD-10-CM

## 2016-08-10 NOTE — Progress Notes (Signed)
Patient ID: Meghan Patterson, female   DOB: April 14, 1961, 55 y.o.   MRN: [id number]     [facility]   PCP: FISCHER, TIMOTHY LEE, DO  Code Status: Full Code  Allergies  Allergen Reactions  . Bee Venom     Chief Complaint  Patient presents with  . Medical Management of Chronic Issues    Routine Visit    Advanced Directives 09/21/2015  Does patient have an advance directive? Yes  Type of Advance Directive Healthcare Power of Attorney  Does patient want to make changes to advanced directive? No - Patient declined  Copy of advanced directive(s) in chart? No - copy requested     HPI:  55 y.o. patient is seen for routine visit. She complaints of heartburn. Her knee pain is under better control. She denies any other concerns. She has been started on b12 supplement for low b12 level. Reviewed labs  Review of Systems:  Constitutional: Negative for fever Eyes: Negative for double vision and discharge. Respiratory: breathing stable on o2.  Cardiovascular: Negative for chest pain, palpitations.  Gastrointestinal: Negative for heartburn, vomiting, nausea, abdominal pain.  Genitourinary: Negative for flank pain and dysuria.  Skin: Negative for itching, rash.  Neurological: Negative for dizziness. Psychiatric/Behavioral: Negative for depression.     Past Medical History:  Diagnosis Date  . Depressive disorder   . Diabetes mellitus without complication (HCC)   . FTT (failure to thrive) in adult   . GERD (gastroesophageal reflux disease)   . Heart failure (HCC)   . Hypercholesteremia   . Hypertension   . Hypothyroid   . Hypoxemia   . Intellectual disability   . Magnesium deficiency   . Rheumatoid arthritis (HCC)     Medications:   Medication List       Accurate as of 08/10/16 11:34 AM. Always use your most recent med list.          acetaminophen 325 MG tablet Commonly known as:  TYLENOL Take 650 mg by mouth every 8 (eight) hours as  needed for fever or headache (or muscle pain).   amLODipine 5 MG tablet Commonly known as:  NORVASC Take 5 mg by mouth daily.   aspirin 81 MG chewable tablet Chew 81 mg by mouth daily.   BIOFREEZE 4 % Gel Generic drug:  Menthol (Topical Analgesic) Apply 1 application topically every 6 (six) hours as needed.   Cholecalciferol 50000 units capsule Take 50,000 Units by mouth once a week. On Thursday. Stop date 09/08/16   Vitamin D3 2000 units Tabs Take 1 tablet by mouth daily.   citalopram 10 MG tablet Commonly known as:  CELEXA Take 10 mg by mouth daily.   Cranberry 250 MG Caps Take 250 mg by mouth 2 (two) times daily.   estradiol 0.1 MG/GM vaginal cream Commonly known as:  ESTRACE Place 1 Applicatorful vaginally daily. For 1 month and then 3 times a week.   fentaNYL 12 MCG/HR Commonly known as:  DURAGESIC - dosed mcg/hr Apply one patch every 72 hours for pain. Remove old patch. External Use. Rotate sites   folic acid 1 MG tablet Commonly known as:  FOLVITE Take 1 mg by mouth daily.   furosemide 20 MG tablet Commonly known as:  LASIX Take 1 tablet (20 mg total) by mouth daily.   ipratropium-albuterol 0.5-2.5 (3) MG/3ML Soln Commonly known as:  DUONEB Take 3 mLs by nebulization every 6 (six) hours as needed.   levothyroxine 150 MCG tablet Commonly known  as:  SYNTHROID, LEVOTHROID Take 150 mcg by mouth every morning.   loratadine 10 MG tablet Commonly known as:  CLARITIN Take 10 mg by mouth daily.   losartan 100 MG tablet Commonly known as:  COZAAR Take 100 mg by mouth daily.   menthol-cetylpyridinium 3 MG lozenge Commonly known as:  CEPACOL Take 1 lozenge by mouth every 6 (six) hours as needed for sore throat.   metFORMIN 1000 MG tablet Commonly known as:  GLUCOPHAGE Take 1,000 mg by mouth 2 (two) times daily with a meal.   methotrexate 2.5 MG tablet Commonly known as:  RHEUMATREX Take 2.5 mg by mouth once a week. Caution:Chemotherapy. Protect from  light. Give 2 tablets= 5 mg by mouth weekly   metoprolol tartrate 25 MG tablet Commonly known as:  LOPRESSOR Take 1 tablet (25 mg total) by mouth 2 (two) times daily.   montelukast 10 MG tablet Commonly known as:  SINGULAIR Take 10 mg by mouth at bedtime.   pantoprazole 40 MG tablet Commonly known as:  PROTONIX Take 40 mg by mouth daily.   PROCEL Powd Take 1 scoop by mouth 2 (two) times daily.   SYSTANE BALANCE 0.6 % Soln Generic drug:  Propylene Glycol Apply 1 drop to eye 2 (two) times daily.   vitamin B-12 1000 MCG tablet Commonly known as:  CYANOCOBALAMIN Take 1,000 mcg by mouth daily.        Physical Exam: BP 140/88   Pulse 70   Temp 97.9 F (36.6 C) (Oral)   Resp 18   Ht 5\' 2"  (1.575 m)   Wt 266 lb (120.7 kg)   SpO2 95%   BMI 48.65 kg/m   Wt Readings from Last 3 Encounters:  08/10/16 266 lb (120.7 kg)  07/14/16 271 lb (122.9 kg)  06/15/16 257 lb 6.4 oz (116.8 kg)    General- adult female, morbidly obese, in no acute distress Head- normocephalic, atraumatic Throat- moist mucus membrane  Eyes- PERRLA, EOMI, no pallor, no icterus Neck- no cervical lymphadenopathy Cardiovascular- normal s1,s2, no murmur Respiratory- bilateral clear to auscultation, no wheeze, no rhonchi, no crackles, no use of accessory muscles, on o2 Abdomen- bowel sounds present, soft, no epigastric tenderness Musculoskeletal- able to move all 4 extremities with limited ROM, bilateral knee limited ROM, trace leg edema +, on wheelchair Neurological-  alert and oriented Skin- warm and dry Psychiatry- normal mood and affect    Labs reviewed: Basic Metabolic Panel:  Recent Labs  06/17/16 04/15/16  NA 138 138  K 3.7 3.8  BUN 21 21  CREATININE 0.5 0.5   Liver Function Tests:  Recent Labs  01/28/16 02/11/16 04/15/16  AST 18 17 16   ALT 15 17 13   ALKPHOS  --  45 44    CBC:  Recent Labs  01/28/16 02/11/16 04/15/16  WBC 2.8 5.1 3.1  HGB 11.6* 11.8* 11.4*  HCT 39 36 37    PLT 210 209 194    Lipid Panel     Component Value Date/Time   CHOL 195 11/24/2015   TRIG 105 11/24/2015   HDL 53 11/24/2015   LDLCALC 121 11/24/2015   Lab Results  Component Value Date   HGBA1C 6.0 06/21/2016   06/02/16 vitamin d 27.65  Lab Results  Component Value Date   TSH 2.16 06/02/2016     Mammogram Digital Screening Bilateral  03/30/16 Impression. No mammographic evidence of malignancy.     Assessment/Plan  GERD Worsened symptom. Currently on protonix 40 mg daily. Start ranitidine 150 mg  daily  Diabetes mellitus type 2 Lab Results  Component Value Date   HGBA1C 6.0 06/21/2016   Check cbg daily for now. Decrease metformin to 500 mg bid for now and monitor.  b12 deficiency anemia Started on B12 1000 mcg daily and monitor  RA Continue methotrexate with folic acid. Monitor h&h periodically. Continue tylenol 650 mg q8h prn pain and fentanyl patch.      Oneal Grout, MD Internal Medicine [facility] [address] [place], [place] [address] Cell Phone (Monday-Friday 8 am - 5 pm): [phone] On Call: [phone] and follow prompts after 5 pm and on weekends Office Phone: [phone] Office Fax: [phone]

## 2016-08-26 LAB — BASIC METABOLIC PANEL
BUN: 17 mg/dL (ref 4–21)
CREATININE: 0.4 mg/dL — AB (ref 0.5–1.1)
Glucose: 209 mg/dL
Potassium: 4.4 mmol/L (ref 3.4–5.3)
Sodium: 138 mmol/L (ref 137–147)

## 2016-08-26 LAB — CBC AND DIFFERENTIAL
HCT: 36 % (ref 36–46)
HEMOGLOBIN: 11.4 g/dL — AB (ref 12.0–16.0)
Platelets: 222 10*3/uL (ref 150–399)
WBC: 3.9 10^3/mL

## 2016-09-26 LAB — HEMOGLOBIN A1C: HEMOGLOBIN A1C: 6.3

## 2016-09-30 ENCOUNTER — Non-Acute Institutional Stay (SKILLED_NURSING_FACILITY): Payer: Medicare Other | Admitting: Internal Medicine

## 2016-09-30 ENCOUNTER — Encounter: Payer: Self-pay | Admitting: Internal Medicine

## 2016-09-30 DIAGNOSIS — F329 Major depressive disorder, single episode, unspecified: Secondary | ICD-10-CM

## 2016-09-30 DIAGNOSIS — I5022 Chronic systolic (congestive) heart failure: Secondary | ICD-10-CM | POA: Diagnosis not present

## 2016-09-30 DIAGNOSIS — J9611 Chronic respiratory failure with hypoxia: Secondary | ICD-10-CM | POA: Diagnosis not present

## 2016-09-30 DIAGNOSIS — R0981 Nasal congestion: Secondary | ICD-10-CM | POA: Diagnosis not present

## 2016-09-30 DIAGNOSIS — I10 Essential (primary) hypertension: Secondary | ICD-10-CM

## 2016-09-30 DIAGNOSIS — I890 Lymphedema, not elsewhere classified: Secondary | ICD-10-CM

## 2016-09-30 NOTE — Progress Notes (Signed)
Patient ID: Meghan Patterson, female   DOB: [birth year], 55 y.o.   MRN: [id number]     [facility]   PCP: FISCHER, TIMOTHY LEE, DO  Code Status: Full Code  Allergies  Allergen Reactions  . Bee Venom     Chief Complaint  Patient presents with  . Medical Management of Chronic Issues    Routine Visit    Advanced Directives 09/21/2015  Does Patient Have a Medical Advance Directive? Yes  Type of Advance Directive Healthcare Power of Attorney  Does patient want to make changes to medical advance directive? No - Patient declined  Copy of Healthcare Power of Attorney in Chart? No - copy requested     HPI:  55 y.o. patient is seen for routine visit. She complains off nasal and chest congestion for 3 days. She has been coughing with mostly dry cough. She also has runny nose with clear nasal discharge. Denies any fever or sore throat. Denies any earache. She has been at her baseline otherwise. She has been compliant with her medications. She is out of bed daily. No falls reported. No new pressure ulcer reported.   Review of Systems:  Constitutional: Negative for fever Eyes: Negative for double vision and discharge. Respiratory: breathing stable on o2.  Cardiovascular: Negative for chest pain, palpitations.  Gastrointestinal: Negative for heartburn, vomiting, nausea, abdominal pain.  Genitourinary: Negative for flank pain and dysuria.  Skin: Negative for itching, rash.  Neurological: Negative for dizziness. Psychiatric/Behavioral: Negative for depression.     Past Medical History:  Diagnosis Date  . Depressive disorder   . Diabetes mellitus without complication (HCC)   . FTT (failure to thrive) in adult   . GERD (gastroesophageal reflux disease)   . Heart failure (HCC)   . Hypercholesteremia   . Hypertension   . Hypothyroid   . Hypoxemia   . Intellectual disability   . Magnesium deficiency   . Rheumatoid arthritis (HCC)      Medications: Allergies as of 09/30/2016      Reactions   Bee Venom       Medication List       Accurate as of 09/30/16 10:49 AM. Always use your most recent med list.          acetaminophen 325 MG tablet Commonly known as:  TYLENOL Take 650 mg by mouth every 8 (eight) hours as needed for fever or headache (or muscle pain).   acetaminophen 500 MG tablet Commonly known as:  TYLENOL Take 1,000 mg by mouth 2 (two) times daily.   amLODipine 5 MG tablet Commonly known as:  NORVASC Take 5 mg by mouth daily.   aspirin 81 MG chewable tablet Chew 81 mg by mouth daily.   BIOFREEZE 4 % Gel Generic drug:  Menthol (Topical Analgesic) Apply 1 application topically every 6 (six) hours as needed.   calcium carbonate 500 MG chewable tablet Commonly known as:  TUMS - dosed in mg elemental calcium Chew 1 tablet by mouth 2 (two) times daily as needed for indigestion or heartburn.   citalopram 10 MG tablet Commonly known as:  CELEXA Take 10 mg by mouth daily.   Cranberry 250 MG Caps Take 250 mg by mouth 2 (two) times daily.   estradiol 0.1 MG/GM vaginal cream Commonly known as:  ESTRACE Place 1 Applicatorful vaginally daily. For 1 month and then 3 times a week. Monday, Wednesday and Friday   fentaNYL 12 MCG/HR Commonly known as:  DURAGESIC - dosed mcg/hr  Apply one patch every 72 hours for pain. Remove old patch. External Use. Rotate sites   folic acid 1 MG tablet Commonly known as:  FOLVITE Take 1 mg by mouth daily.   furosemide 20 MG tablet Commonly known as:  LASIX Take 1 tablet (20 mg total) by mouth daily.   ipratropium-albuterol 0.5-2.5 (3) MG/3ML Soln Commonly known as:  DUONEB Take 3 mLs by nebulization every 6 (six) hours as needed.   levothyroxine 150 MCG tablet Commonly known as:  SYNTHROID, LEVOTHROID Take 150 mcg by mouth every morning.   loratadine 10 MG tablet Commonly known as:  CLARITIN Take 10 mg by mouth daily.   losartan 100 MG  tablet Commonly known as:  COZAAR Take 100 mg by mouth daily.   menthol-cetylpyridinium 3 MG lozenge Commonly known as:  CEPACOL Take 1 lozenge by mouth every 6 (six) hours as needed for sore throat.   metFORMIN 1000 MG tablet Commonly known as:  GLUCOPHAGE Take 500 mg by mouth 2 (two) times daily with a meal.   methotrexate 2.5 MG tablet Commonly known as:  RHEUMATREX Take 2.5 mg by mouth once a week. Caution:Chemotherapy. Protect from light. Give 2 tablets= 5 mg by mouth weekly   metoprolol tartrate 25 MG tablet Commonly known as:  LOPRESSOR Take 1 tablet (25 mg total) by mouth 2 (two) times daily.   montelukast 10 MG tablet Commonly known as:  SINGULAIR Take 10 mg by mouth at bedtime.   pantoprazole 40 MG tablet Commonly known as:  PROTONIX Take 40 mg by mouth daily.   PROCEL Powd Take 1 scoop by mouth 2 (two) times daily.   ranitidine 150 MG tablet Commonly known as:  ZANTAC Take 150 mg by mouth daily.   SYSTANE BALANCE 0.6 % Soln Generic drug:  Propylene Glycol Apply 1 drop to eye 2 (two) times daily.   vitamin B-12 1000 MCG tablet Commonly known as:  CYANOCOBALAMIN Take 1,000 mcg by mouth daily.   Vitamin D3 2000 units Tabs Take 1 tablet by mouth daily.        Physical Exam: BP 138/77   Pulse 96   Temp 97.4 F (36.3 C) (Oral)   Resp 19   Ht 5\' 2"  (1.575 m)   Wt 277 lb 12.8 oz (126 kg)   SpO2 98%   BMI 50.81 kg/m   Wt Readings from Last 3 Encounters:  09/30/16 277 lb 12.8 oz (126 kg)  08/10/16 266 lb (120.7 kg)  07/14/16 271 lb (122.9 kg)    General- adult female, morbidly obese, in no acute distress Head- normocephalic, atraumatic Throat- moist mucus membrane, normal oropharynx Nose- clear nasal discharge, o2 by [place], no sinus tenderness Eyes- PERRLA, EOMI, no pallor, no icterus Neck- no cervical lymphadenopathy Cardiovascular- normal s1,s2, no murmur Respiratory- bilateral clear to auscultation, no wheeze, no rhonchi, no crackles, no use  of accessory muscles, on o2 Abdomen- bowel sounds present, soft, non tender Musculoskeletal- able to move all 4 extremities with limited ROM, bilateral knee limited ROM, on wheelchair Neurological-  alert and oriented Skin- warm and dry Psychiatry- normal mood and affect    Labs reviewed: Basic Metabolic Panel:  Recent Labs  07/16/16 04/15/16  NA 138 138  K 3.7 3.8  BUN 21 21  CREATININE 0.5 0.5   Liver Function Tests:  Recent Labs  01/28/16 02/11/16 04/15/16  AST 18 17 16   ALT 15 17 13   ALKPHOS  --  45 44    CBC:  Recent Labs  02/11/16 04/15/16 08/26/16  WBC 5.1 3.1 3.9  HGB 11.8* 11.4* 11.4*  HCT 36 37 36  PLT 209 194 222    Lipid Panel     Component Value Date/Time   CHOL 195 11/24/2015   TRIG 105 11/24/2015   HDL 53 11/24/2015   LDLCALC 121 11/24/2015   Lab Results  Component Value Date   HGBA1C 6.3 09/26/2016   06/02/16 vitamin d 27.65  Lab Results  Component Value Date   TSH 2.16 06/02/2016     Mammogram Digital Screening Bilateral  03/30/16 Impression. No mammographic evidence of malignancy.     Assessment/Plan  Nasal congestion No signs of acute sinusitis or acute upper respiratory tract infection on exam. Monitor clinically for now and treat symptomatically. Start Robitussin 10 mL 3 times a day for 1 week. Start Flonase 1 spray to each nostril daily for 1 week. Monitor clinically continue her oxygen by nasal cannula.  Chronic respiratory failure Continue her oxygen by nasal cannula, DuoNeb on a needed basis and her daily Singulair. Monitor her breathing.   Chronic lymphedema Stable. No weeping lesions. Continue Lasix 20 mg daily.  Chronic systolic heart failure Appears euvolemic on exam. Monitor for signs of fluid overload. Continue current regimen of metoprolol, losartan and furosemide. Monitor BMP periodically.   Hypertension Blood pressure readings stable on review. Continue metoprolol tartrate 25 mg twice a day and amlodipine 5  mg daily. Also continue her losartan 100 mg daily. Monitor blood pressure reading.   Chronic depression Mode overall stable. Continue Celexa 10 mg daily.     Oneal Grout, MD Internal Medicine [facility] [address] [place], [place] [address] Cell Phone (Monday-Friday 8 am - 5 pm): [phone] On Call: [phone] and follow prompts after 5 pm and on weekends Office Phone: [phone] Office Fax: [phone]

## 2016-10-26 ENCOUNTER — Encounter: Payer: Self-pay | Admitting: Internal Medicine

## 2016-10-26 ENCOUNTER — Non-Acute Institutional Stay (SKILLED_NURSING_FACILITY): Payer: Medicare Other | Admitting: Internal Medicine

## 2016-10-26 DIAGNOSIS — K5900 Constipation, unspecified: Secondary | ICD-10-CM

## 2016-10-26 DIAGNOSIS — K648 Other hemorrhoids: Secondary | ICD-10-CM | POA: Diagnosis not present

## 2016-10-26 DIAGNOSIS — K625 Hemorrhage of anus and rectum: Secondary | ICD-10-CM | POA: Diagnosis not present

## 2016-10-26 DIAGNOSIS — I890 Lymphedema, not elsewhere classified: Secondary | ICD-10-CM

## 2016-10-26 NOTE — Progress Notes (Signed)
Patient ID: Meghan Patterson, female   DOB: 11-Sep-1961, 56 y.o.   MRN: [id number]     [facility]   PCP: FISCHER, TIMOTHY LEE, DO  Code Status: Full Code  Allergies  Allergen Reactions  . Bee Venom     Chief Complaint  Patient presents with  . Medical Management of Chronic Issues    Routine Visit     Advanced Directives 09/21/2015  Does Patient Have a Medical Advance Directive? Yes  Type of Advance Directive Healthcare Power of Attorney  Does patient want to make changes to medical advance directive? No - Patient declined  Copy of Healthcare Power of Attorney in Chart? No - copy requested     HPI:  56 y.o. patient is seen for routine visit. She complains of bleeding from her rectum few times over the last week. Nursing has evaluated the patient hasn't found any evidence of bleed. She complains of straining with bowel movement and hard stool. She has also noticed a swelling in her leg to increase. She is sitting on her wheelchair for most part of the day with her legs hanging. She denies any trouble with her breathing. No other concerns.  Review of Systems:  Constitutional: Negative for fever Eyes: Negative for double vision and discharge. Respiratory: breathing stable on o2.  Cardiovascular: Negative for chest pain, palpitations.  Gastrointestinal: Negative for heartburn, vomiting, nausea, abdominal pain.  Genitourinary: Negative for dysuria.  Skin: Negative for itching, rash.  Neurological: Negative for dizziness. Psychiatric/Behavioral: Negative for depression.     Past Medical History:  Diagnosis Date  . Depressive disorder   . Diabetes mellitus without complication (HCC)   . FTT (failure to thrive) in adult   . GERD (gastroesophageal reflux disease)   . Heart failure (HCC)   . Hypercholesteremia   . Hypertension   . Hypothyroid   . Hypoxemia   . Intellectual disability   . Magnesium deficiency   . Rheumatoid arthritis (HCC)      Medications: Allergies as of 10/26/2016      Reactions   Bee Venom       Medication List       Accurate as of 10/26/16 12:11 PM. Always use your most recent med list.          acetaminophen 325 MG tablet Commonly known as:  TYLENOL Take 650 mg by mouth every 8 (eight) hours as needed for fever or headache (or muscle pain).   acetaminophen 500 MG tablet Commonly known as:  TYLENOL Take 1,000 mg by mouth 2 (two) times daily.   amLODipine 5 MG tablet Commonly known as:  NORVASC Take 5 mg by mouth daily.   aspirin 81 MG chewable tablet Chew 81 mg by mouth daily.   BIOFREEZE 4 % Gel Generic drug:  Menthol (Topical Analgesic) Apply 1 application topically every 6 (six) hours as needed.   calcium carbonate 500 MG chewable tablet Commonly known as:  TUMS - dosed in mg elemental calcium Chew 1 tablet by mouth 2 (two) times daily as needed for indigestion or heartburn.   citalopram 10 MG tablet Commonly known as:  CELEXA Take 10 mg by mouth daily.   Cranberry 250 MG Caps Take 250 mg by mouth 2 (two) times daily.   estradiol 0.1 MG/GM vaginal cream Commonly known as:  ESTRACE Place 1 Applicatorful vaginally daily. For 1 month and then 3 times a week. Monday, Wednesday and Friday   fentaNYL 12 MCG/HR Commonly known as:  DURAGESIC - dosed mcg/hr Apply one patch every 72 hours for pain. Remove old patch. External Use. Rotate sites   folic acid 1 MG tablet Commonly known as:  FOLVITE Take 1 mg by mouth daily.   furosemide 20 MG tablet Commonly known as:  LASIX Take 1 tablet (20 mg total) by mouth daily.   ipratropium-albuterol 0.5-2.5 (3) MG/3ML Soln Commonly known as:  DUONEB Take 3 mLs by nebulization every 6 (six) hours as needed.   levothyroxine 150 MCG tablet Commonly known as:  SYNTHROID, LEVOTHROID Take 150 mcg by mouth every morning.   loratadine 10 MG tablet Commonly known as:  CLARITIN Take 10 mg by mouth daily.   losartan 100 MG tablet Commonly  known as:  COZAAR Take 100 mg by mouth daily.   menthol-cetylpyridinium 3 MG lozenge Commonly known as:  CEPACOL Take 1 lozenge by mouth every 6 (six) hours as needed for sore throat.   metFORMIN 1000 MG tablet Commonly known as:  GLUCOPHAGE Take 500 mg by mouth 2 (two) times daily with a meal.   methotrexate 2.5 MG tablet Commonly known as:  RHEUMATREX Take 2.5 mg by mouth once a week. Caution:Chemotherapy. Protect from light. Give 2 tablets= 5 mg by mouth weekly   metoprolol tartrate 25 MG tablet Commonly known as:  LOPRESSOR Take 1 tablet (25 mg total) by mouth 2 (two) times daily.   montelukast 10 MG tablet Commonly known as:  SINGULAIR Take 10 mg by mouth at bedtime.   pantoprazole 40 MG tablet Commonly known as:  PROTONIX Take 40 mg by mouth daily.   ranitidine 150 MG tablet Commonly known as:  ZANTAC Take 150 mg by mouth daily.   SYSTANE BALANCE 0.6 % Soln Generic drug:  Propylene Glycol Apply 1 drop to eye 2 (two) times daily.   vitamin B-12 1000 MCG tablet Commonly known as:  CYANOCOBALAMIN Take 1,000 mcg by mouth daily.   Vitamin D3 2000 units Tabs Take 1 tablet by mouth daily.   zinc oxide 11.3 % Crea cream Commonly known as:  BALMEX Apply 1 application topically as needed.        Physical Exam: BP 120/79   Pulse 74   Temp 97.8 F (36.6 C) (Oral)   Resp 18   Ht 5\' 2"  (1.575 m)   Wt 276 lb 3.2 oz (125.3 kg)   BMI 50.52 kg/m   Wt Readings from Last 3 Encounters:  10/26/16 276 lb 3.2 oz (125.3 kg)  09/30/16 277 lb 12.8 oz (126 kg)  08/10/16 266 lb (120.7 kg)    General- adult female, morbidly obese, in no acute distress Head- normocephalic, atraumatic Throat- moist mucus membrane Nose- no nasal discharge Eyes- no pallor, no icterus Neck- no cervical lymphadenopathy Cardiovascular- normal s1,s2, no murmur Respiratory- bilateral clear to auscultation, on o2 Abdomen- bowel sounds present, soft, non tender, rectal exam shows no external  hemorrhoids and no blood in rectal area and her briefs Musculoskeletal- able to move all 4 extremities with limited ROM, on wheelchair Neurological-  alert and oriented Skin- warm and dry Psychiatry- normal mood and affect    Labs reviewed: Basic Metabolic Panel:  Recent Labs  08/12/16 04/15/16  NA 138 138  K 3.7 3.8  BUN 21 21  CREATININE 0.5 0.5   Liver Function Tests:  Recent Labs  01/28/16 02/11/16 04/15/16  AST 18 17 16   ALT 15 17 13   ALKPHOS  --  45 44    CBC:  Recent Labs  02/11/16 04/15/16  08/26/16  WBC 5.1 3.1 3.9  HGB 11.8* 11.4* 11.4*  HCT 36 37 36  PLT 209 194 222    Lipid Panel     Component Value Date/Time   CHOL 195 11/24/2015   TRIG 105 11/24/2015   HDL 53 11/24/2015   LDLCALC 121 11/24/2015   Lab Results  Component Value Date   HGBA1C 6.3 09/26/2016   06/02/16 vitamin d 27.65  Lab Results  Component Value Date   TSH 2.16 06/02/2016     Mammogram Digital Screening Bilateral  03/30/16 Impression. No mammographic evidence of malignancy.     Assessment/Plan  Rectal bleed None on exam. She complaints of hard stool and straining. Possible bleed from internal hemorrhoids. Check FOBT X 3. Check cbc. Monitor. See below  Internal hemorrhoids Start senna s 2 tab qhs to help prevent hard stools and thus reduce risk for bleed. Start hydrocortisone suppository bid x 2 weeks and monitor  Constipation Start senna s as above and monitor. Hydration encouraged.   Chronic lymphedema Edema has increased. Advised patient to keep legs elevated at rest. No weeping lesions. Continue Lasix 20 mg daily. Discussed with treatment nurse and will need to apply compression wrap     Oneal Grout, MD Internal Medicine [facility] [address] [place], [place] [address] Cell Phone (Monday-Friday 8 am - 5 pm): [phone] On Call: [phone] and follow prompts after 5 pm and on weekends Office Phone:  [phone] Office Fax: [phone]

## 2016-10-27 LAB — CBC AND DIFFERENTIAL
HEMATOCRIT: 35 % — AB (ref 36–46)
Hemoglobin: 10.8 g/dL — AB (ref 12.0–16.0)
PLATELETS: 229 10*3/uL (ref 150–399)
WBC: 3.1 10^3/mL

## 2017-01-20 ENCOUNTER — Non-Acute Institutional Stay (SKILLED_NURSING_FACILITY): Payer: Medicare Other | Admitting: Internal Medicine

## 2017-01-20 DIAGNOSIS — I10 Essential (primary) hypertension: Secondary | ICD-10-CM

## 2017-01-20 DIAGNOSIS — K219 Gastro-esophageal reflux disease without esophagitis: Secondary | ICD-10-CM

## 2017-01-20 DIAGNOSIS — E039 Hypothyroidism, unspecified: Secondary | ICD-10-CM

## 2017-01-20 DIAGNOSIS — I890 Lymphedema, not elsewhere classified: Secondary | ICD-10-CM

## 2017-01-20 NOTE — Progress Notes (Signed)
Patient ID: Meghan Patterson, female   DOB: [birth year], 56 y.o.   MRN: [id number]     [facility]   PCP: FISCHER, TIMOTHY LEE, DO  Code Status: Full Code  Allergies  Allergen Reactions  . Bee Venom     Chief Complaint  Patient presents with  . Medical Management of Chronic Issues    Routine Visit    Advanced Directives 09/21/2015  Does Patient Have a Medical Advance Directive? Yes  Type of Advance Directive Healthcare Power of Attorney  Does patient want to make changes to medical advance directive? No - Patient declined  Copy of Healthcare Power of Attorney in Chart? No - copy requested     HPI:  56 y.o. patient is seen for routine visit. She complains of soreness to her right leg x 2 weeks. she denies any trauma. No other concern this visit.   Review of Systems:  Constitutional: Negative for fever Eyes: Negative for eye pain, double vision and discharge Respiratory: breathing stable on oxygen Cardiovascular: Negative for chest pain, palpitations  Gastrointestinal: Negative for heartburn, vomiting, nausea, abdominal pain Genitourinary: Negative for dysuria  Skin: Negative for itching, rash  Neurological: Negative for dizziness Psychiatric/Behavioral: Negative for depression     Past Medical History:  Diagnosis Date  . Depressive disorder   . Diabetes mellitus without complication (HCC)   . FTT (failure to thrive) in adult   . GERD (gastroesophageal reflux disease)   . Heart failure (HCC)   . Hypercholesteremia   . Hypertension   . Hypothyroid   . Hypoxemia   . Intellectual disability   . Magnesium deficiency   . Rheumatoid arthritis (HCC)     Medications: Allergies as of 01/20/2017      Reactions   Bee Venom       Medication List       Accurate as of 01/20/17  3:17 PM. Always use your most recent med list.          acetaminophen 325 MG tablet Commonly known as:  TYLENOL Take 650 mg by mouth every 8 (eight) hours  as needed for fever or headache (or muscle pain).   acetaminophen 500 MG tablet Commonly known as:  TYLENOL Take 1,000 mg by mouth 2 (two) times daily.   aspirin 81 MG chewable tablet Chew 81 mg by mouth daily.   BIOFREEZE 4 % Gel Generic drug:  Menthol (Topical Analgesic) Apply 1 application topically every 6 (six) hours as needed.   calcium carbonate 500 MG chewable tablet Commonly known as:  TUMS - dosed in mg elemental calcium Chew 1 tablet by mouth 2 (two) times daily as needed for indigestion or heartburn.   citalopram 10 MG tablet Commonly known as:  CELEXA Take 10 mg by mouth daily.   Cranberry 250 MG Caps Take 250 mg by mouth 2 (two) times daily.   estradiol 0.1 MG/GM vaginal cream Commonly known as:  ESTRACE Place 1 Applicatorful vaginally 3 (three) times a week. Monday, Wednesday and Friday   fentaNYL 12 MCG/HR Commonly known as:  DURAGESIC - dosed mcg/hr Apply one patch every 72 hours for pain. Remove old patch. External Use. Rotate sites   folic acid 1 MG tablet Commonly known as:  FOLVITE Take 1 mg by mouth daily.   furosemide 20 MG tablet Commonly known as:  LASIX Take 1 tablet (20 mg total) by mouth daily.   ipratropium-albuterol 0.5-2.5 (3) MG/3ML Soln Commonly known as:  DUONEB Take 3 mLs  by nebulization every 6 (six) hours as needed.   levothyroxine 150 MCG tablet Commonly known as:  SYNTHROID, LEVOTHROID Take 150 mcg by mouth every morning.   loratadine 10 MG tablet Commonly known as:  CLARITIN Take 10 mg by mouth daily.   losartan 100 MG tablet Commonly known as:  COZAAR Take 100 mg by mouth daily.   menthol-cetylpyridinium 3 MG lozenge Commonly known as:  CEPACOL Take 1 lozenge by mouth every 6 (six) hours as needed for sore throat.   metFORMIN 1000 MG tablet Commonly known as:  GLUCOPHAGE Take 500 mg by mouth 2 (two) times daily with a meal.   metoprolol tartrate 25 MG tablet Commonly known as:  LOPRESSOR Take 1 tablet (25 mg  total) by mouth 2 (two) times daily.   montelukast 10 MG tablet Commonly known as:  SINGULAIR Take 10 mg by mouth daily.   mupirocin ointment 2 % Commonly known as:  BACTROBAN Place 1 application into the nose daily. Apply to right lower extremity   OXYGEN Inhale 2 L into the lungs continuous.   polyethylene glycol packet Commonly known as:  MIRALAX / GLYCOLAX Take 17 g by mouth daily.   ranitidine 75 MG tablet Commonly known as:  ZANTAC Take 75 mg by mouth daily.   sennosides-docusate sodium 8.6-50 MG tablet Commonly known as:  SENOKOT-S Take 2 tablets by mouth at bedtime.   SYSTANE BALANCE 0.6 % Soln Generic drug:  Propylene Glycol Apply 1 drop to eye 2 (two) times daily.   vitamin B-12 1000 MCG tablet Commonly known as:  CYANOCOBALAMIN Take 1,000 mcg by mouth daily.   Vitamin D3 2000 units Tabs Take 1 tablet by mouth daily.   witch hazel-glycerin pad Commonly known as:  TUCKS Apply 1 application topically 3 (three) times daily as needed for itching. Apply to external rectum   zinc oxide 11.3 % Crea cream Commonly known as:  BALMEX Apply 1 application topically as needed.        Physical Exam: BP 112/67   Pulse 71   Temp 97.8 F (36.6 C) (Oral)   Resp 18   Ht 5\' 2"  (1.575 m)   Wt 274 lb 6.4 oz (124.5 kg)   SpO2 96%   BMI 50.19 kg/m   Wt Readings from Last 3 Encounters:  01/20/17 274 lb 6.4 oz (124.5 kg)  10/26/16 276 lb 3.2 oz (125.3 kg)  09/30/16 277 lb 12.8 oz (126 kg)    General- adult female, morbidly obese, in no acute distress Head- normocephalic, atraumatic Throat- moist mucus membrane Nose- no nasal discharge Eyes- no pallor, no icterus Neck- no cervical lymphadenopathy Cardiovascular- normal s1,s2, no murmur Respiratory- bilateral clear to auscultation, on o2 Abdomen- bowel sounds present, soft, non tender Musculoskeletal- able to move all 4 extremities with limited ROM, on wheelchair, bilateral lymphedema with increased  swelling Neurological-  alert and oriented to person, place and time Skin- warm and dry, erythematous area to RLE that is blanchable, normal temp to touch, no drainage Psychiatry- normal mood and affect    Labs reviewed: Basic Metabolic Panel:  Recent Labs  10/02/16 04/15/16 08/26/16  NA 138 138 138  K 3.7 3.8 4.4  BUN 21 21 17   CREATININE 0.5 0.5 0.4*   Liver Function Tests:  Recent Labs  01/28/16 02/11/16 04/15/16  AST 18 17 16   ALT 15 17 13   ALKPHOS  --  45 44    CBC:  Recent Labs  04/15/16 08/26/16 10/27/16  WBC 3.1 3.9 3.1  HGB 11.4* 11.4* 10.8*  HCT 37 36 35*  PLT 194 222 229    Lipid Panel     Component Value Date/Time   CHOL 195 11/24/2015   TRIG 105 11/24/2015   HDL 53 11/24/2015   LDLCALC 121 11/24/2015   Lab Results  Component Value Date   HGBA1C 6.3 09/26/2016   06/02/16 vitamin d 27.65  Lab Results  Component Value Date   TSH 2.16 06/02/2016     Mammogram Digital Screening Bilateral  03/30/16 Impression. No mammographic evidence of malignancy.     Assessment/Plan  Acquired lymphedema With increased swelling. Patient is mostly on her wheelchair with her legs hanging. No open sores noted. Currently on lasix 20 mg daily. Change this to lasix 20 mg bid for now with kcl 20 meq daily. provide 4 layer compression wrap and reassess in 1 week. Reviewed care plan with treatment nurse and NP Tia.  Hypertension No blood pressure reading available for review. Obtained bedside VS this am. Stable. Continue Metoprolol tartrate 25 mg twice a day and losartan 100 mg daily. Will have patient on blood pressure check daily x 1 week, then twice a week for now given increase in dose of Lasix. Check BMP in 1 week.  Hypothyroidism Lab Results  Component Value Date   TSH 2.16 06/02/2016   Check TSH, continue Levothyroxine 150 g daily  GERD Denies any symptoms this visit. Continue ranitidine 75 mg daily and monitor.    Oneal Grout, MD Internal  Medicine [facility] [address] [place], [place] [address] Cell Phone (Monday-Friday 8 am - 5 pm): [phone] On Call: [phone] and follow prompts after 5 pm and on weekends Office Phone: [phone] Office Fax: [phone]

## 2017-02-28 ENCOUNTER — Ambulatory Visit (SKILLED_NURSING_FACILITY): Payer: Medicare Other

## 2017-02-28 DIAGNOSIS — Z0000 Encounter for general adult medical examination without abnormal findings: Secondary | ICD-10-CM

## 2017-02-28 NOTE — Progress Notes (Addendum)
Go to Chart, Chart Review, Notes then click DOS, Detailed Report, Links Previous Version to see Sara's Progress Notes  

## 2017-02-28 NOTE — Progress Notes (Addendum)
Subjective:   Meghan Patterson is a 56 y.o. female who presents for an Initial Medicare Annual Wellness Visit at [facility]     Objective:    Today's Vitals   02/28/17 1052  BP: 112/67  Pulse: 85  Temp: 98 F (36.7 C)  TempSrc: Oral  Weight: 274 lb (124.3 kg)  Height: 5\' 2"  (1.575 m)   Body mass index is 50.12 kg/m.   Current Medications (verified) Outpatient Encounter Prescriptions as of 02/28/2017  Medication Sig  . acetaminophen (TYLENOL) 325 MG tablet Take 650 mg by mouth every 8 (eight) hours as needed for fever or headache (or muscle pain).   Marland Kitchen acetaminophen (TYLENOL) 500 MG tablet Take 1,000 mg by mouth 2 (two) times daily.  Marland Kitchen aspirin 81 MG chewable tablet Chew 81 mg by mouth daily.  . calcium carbonate (TUMS - DOSED IN MG ELEMENTAL CALCIUM) 500 MG chewable tablet Chew 1 tablet by mouth 2 (two) times daily as needed for indigestion or heartburn.  . Cholecalciferol (VITAMIN D3) 2000 units TABS Take 1 tablet by mouth daily.  . citalopram (CELEXA) 10 MG tablet Take 10 mg by mouth daily.  . Cranberry 250 MG CAPS Take 250 mg by mouth 2 (two) times daily.  Marland Kitchen estradiol (ESTRACE) 0.1 MG/GM vaginal cream Place 1 Applicatorful vaginally 3 (three) times a week. Monday, Wednesday and Friday  . fentaNYL (DURAGESIC - DOSED MCG/HR) 12 MCG/HR Apply one patch every 72 hours for pain. Remove old patch. External Use. Rotate sites  . folic acid (FOLVITE) 1 MG tablet Take 1 mg by mouth daily.   . furosemide (LASIX) 20 MG tablet Take 1 tablet (20 mg total) by mouth daily.  Marland Kitchen ipratropium-albuterol (DUONEB) 0.5-2.5 (3) MG/3ML SOLN Take 3 mLs by nebulization every 6 (six) hours as needed.  Marland Kitchen levothyroxine (SYNTHROID, LEVOTHROID) 150 MCG tablet Take 150 mcg by mouth every morning.   . loratadine (CLARITIN) 10 MG tablet Take 10 mg by mouth daily.   Marland Kitchen losartan (COZAAR) 100 MG tablet Take 100 mg by mouth daily.  . Menthol, Topical Analgesic, (BIOFREEZE) 4 % GEL Apply 1 application  topically every 6 (six) hours as needed.  . menthol-cetylpyridinium (CEPACOL) 3 MG lozenge Take 1 lozenge by mouth every 6 (six) hours as needed for sore throat.  . metFORMIN (GLUCOPHAGE) 1000 MG tablet Take 500 mg by mouth 2 (two) times daily with a meal.   . metoprolol tartrate (LOPRESSOR) 25 MG tablet Take 1 tablet (25 mg total) by mouth 2 (two) times daily.  . montelukast (SINGULAIR) 10 MG tablet Take 10 mg by mouth daily.   . mupirocin ointment (BACTROBAN) 2 % Place 1 application into the nose daily. Apply to right lower extremity  . OXYGEN Inhale 2 L into the lungs continuous.  . polyethylene glycol (MIRALAX / GLYCOLAX) packet Take 17 g by mouth daily.  Marland Kitchen Propylene Glycol (SYSTANE BALANCE) 0.6 % SOLN Apply 1 drop to eye 2 (two) times daily.  . ranitidine (ZANTAC) 75 MG tablet Take 75 mg by mouth daily.  . sennosides-docusate sodium (SENOKOT-S) 8.6-50 MG tablet Take 2 tablets by mouth at bedtime.  . vitamin B-12 (CYANOCOBALAMIN) 1000 MCG tablet Take 1,000 mcg by mouth daily.  Marland Kitchen witch hazel-glycerin (TUCKS) pad Apply 1 application topically 3 (three) times daily as needed for itching. Apply to external rectum  . zinc oxide (BALMEX) 11.3 % CREA cream Apply 1 application topically as needed.   No facility-administered encounter medications on file as of  02/28/2017.     Allergies (verified) Bee venom   History: Past Medical History:  Diagnosis Date  . Depressive disorder   . Diabetes mellitus without complication (HCC)   . FTT (failure to thrive) in adult   . GERD (gastroesophageal reflux disease)   . Heart failure (HCC)   . Hypercholesteremia   . Hypertension   . Hypothyroid   . Hypoxemia   . Intellectual disability   . Magnesium deficiency   . Rheumatoid arthritis [facility])    Past Surgical History:  Procedure Laterality Date  . ABDOMINAL HYSTERECTOMY     Family History  Problem Relation Age of Onset  . Diabetes Mother   . Hypertension Father   . Emphysema Father     Social History   Occupational History  . Not on file.   Social History Main Topics  . Smoking status: Never Smoker  . Smokeless tobacco: Never Used  . Alcohol use No  . Drug use: No  . Sexual activity: Not on file    Tobacco Counseling Counseling given: Not Answered   Activities of Daily Living In your present state of health, do you have any difficulty performing the following activities: 02/28/2017  Hearing? N  Vision? N  Difficulty concentrating or making decisions? N  Walking or climbing stairs? Y  Dressing or bathing? N  Doing errands, shopping? Y  Preparing Food and eating ? Y  Using the Toilet? Y  In the past six months, have you accidently leaked urine? N  Do you have problems with loss of bowel control? N  Managing your Medications? Y  Managing your Finances? Y  Housekeeping or managing your Housekeeping? Y  Some recent data might be hidden    Immunizations and Health Maintenance Immunization History  Administered Date(s) Administered  . Influenza-Unspecified 07/31/2015  . PPD Test 07/31/2015, 08/05/2015  . Pneumococcal Polysaccharide-23 07/31/2015  . Pneumococcal-Unspecified 08/21/2015   There are no preventive care reminders to display for this patient.  Patient Care Team: Fischer, Suann Larry, DO as PCP - General (Family Medicine)  Indicate any recent Medical Services you may have received from other than [facility] providers in the past year (date may be approximate).     Assessment:   This is a routine wellness examination for Meghan Patterson.   Hearing/Vision screen No exam data present  Dietary issues and exercise activities discussed: Current Exercise Habits: The patient does not participate in regular exercise at present, Exercise limited by: orthopedic condition(s)  Goals    . Maintain Lifestyle          Starting today pt will maintain lifestyle      Depression Screen PHQ 2/9 Scores 02/28/2017  PHQ - 2 Score 0    Fall Risk Fall Risk   02/28/2017  Falls in the past year? Yes  Number falls in past yr: 2 or more  Injury with Fall? Yes    Cognitive Function:     6CIT Screen 02/28/2017  What Year? 4 points  What month? 3 points  What time? 0 points  Count back from 20 0 points  Months in reverse 0 points  Repeat phrase 0 points  Total Score 7    Screening Tests Health Maintenance  Topic Date Due  . INFLUENZA VACCINE  10/17/2017 (Originally 05/17/2017)  . PAP SMEAR  10/17/2017 (Originally 06/22/1982)  . OPHTHALMOLOGY EXAM  10/17/2017 (Originally 04/28/2016)  . Hepatitis C Screening  10/17/2018 (Originally [birth year])  . HIV Screening  10/17/2018 (Originally 06/22/1976)  . TETANUS/TDAP  10/18/2023 (  Originally 06/22/1980)  . FOOT EXAM  03/02/2017  . HEMOGLOBIN A1C  03/27/2017  . MAMMOGRAM  03/30/2018  . PNEUMOCOCCAL POLYSACCHARIDE VACCINE (2) 07/30/2020  . COLONOSCOPY  03/21/2023      Plan:    I have personally reviewed and addressed the Medicare Annual Wellness questionnaire and have noted the following in the patient's chart:  A. Medical and social history B. Use of alcohol, tobacco or illicit drugs  C. Current medications and supplements D. Functional ability and status E.  Nutritional status F.  Physical activity G. Advance directives H. List of other physicians I.  Hospitalizations, surgeries, and ER visits in previous 12 months J.  Vitals K. Screenings to include hearing, vision, cognitive, depression L. Referrals and appointments - none  In addition, I have reviewed and discussed with patient certain preventive protocols, quality metrics, and best practice recommendations. A written personalized care plan for preventive services as well as general preventive health recommendations were provided to patient.  See attached scanned questionnaire for additional information.   Signed,   Annetta Maw, RN Nurse Health Advisor    I agree with above and was available to answer medical question/ concerns from  Nurse Health Advisor.  Oneal Grout, MD Internal Medicine [facility] [address] [place], [place] [address] Cell Phone (Monday-Friday 8 am - 5 pm): [phone] On Call: [phone] and follow prompts after 5 pm and on weekends Office Phone: [phone] Office Fax: [phone]

## 2017-02-28 NOTE — Patient Instructions (Addendum)
[name] , Thank you for taking time to come for your Medicare Wellness Visit. I appreciate your ongoing commitment to your health goals. Please review the following plan we discussed and let me know if I can assist you in the future.   Screening recommendations/referrals: Colonoscopy up to date. Due 03/21/2023 Mammogram up to date. Due 03/30/2018 Bone Density due at age 56 Recommended yearly ophthalmology/optometry visit for glaucoma screening and checkup Recommended yearly dental visit for hygiene and checkup  Vaccinations: Influenza vaccine due Pneumococcal vaccine due at age 18 Tdap vaccine not in records Shingles vaccine not in records  Advanced directives: Copy needed for records.  Conditions/risks identified: None  Next appointment: None upcoming  Preventive Care 40-64 Years, Female Preventive care refers to lifestyle choices and visits with your health care provider that can promote health and wellness. What does preventive care include?  A yearly physical exam. This is also called an annual well check.  Dental exams once or twice a year.  Routine eye exams. Ask your health care provider how often you should have your eyes checked.  Personal lifestyle choices, including:  Daily care of your teeth and gums.  Regular physical activity.  Eating a healthy diet.  Avoiding tobacco and drug use.  Limiting alcohol use.  Practicing safe sex.  Taking low-dose aspirin daily starting at age 8.  Taking vitamin and mineral supplements as recommended by your health care provider. What happens during an annual well check? The services and screenings done by your health care provider during your annual well check will depend on your age, overall health, lifestyle risk factors, and family history of disease. Counseling  Your health care provider may ask you questions about your:  Alcohol use.  Tobacco use.  Drug use.  Emotional well-being.  Home and relationship  well-being.  Sexual activity.  Eating habits.  Work and work Statistician.  Method of birth control.  Menstrual cycle.  Pregnancy history. Screening  You may have the following tests or measurements:  Height, weight, and BMI.  Blood pressure.  Lipid and cholesterol levels. These may be checked every 5 years, or more frequently if you are over 3 years old.  Skin check.  Lung cancer screening. You may have this screening every year starting at age 67 if you have a 30-pack-year history of smoking and currently smoke or have quit within the past 15 years.  Fecal occult blood test (FOBT) of the stool. You may have this test every year starting at age 58.  Flexible sigmoidoscopy or colonoscopy. You may have a sigmoidoscopy every 5 years or a colonoscopy every 10 years starting at age 92.  Hepatitis C blood test.  Hepatitis B blood test.  Sexually transmitted disease (STD) testing.  Diabetes screening. This is done by checking your blood sugar (glucose) after you have not eaten for a while (fasting). You may have this done every 1-3 years.  Mammogram. This may be done every 1-2 years. Talk to your health care provider about when you should start having regular mammograms. This may depend on whether you have a family history of breast cancer.  BRCA-related cancer screening. This may be done if you have a family history of breast, ovarian, tubal, or peritoneal cancers.  Pelvic exam and Pap test. This may be done every 3 years starting at age 78. Starting at age 60, this may be done every 5 years if you have a Pap test in combination with an HPV test.  Bone density scan.  This is done to screen for osteoporosis. You may have this scan if you are at high risk for osteoporosis. Discuss your test results, treatment options, and if necessary, the need for more tests with your health care provider. Vaccines  Your health care provider may recommend certain vaccines, such  as:  Influenza vaccine. This is recommended every year.  Tetanus, diphtheria, and acellular pertussis (Tdap, Td) vaccine. You may need a Td booster every 10 years.  Zoster vaccine. You may need this after age 42.  Pneumococcal 13-valent conjugate (PCV13) vaccine. You may need this if you have certain conditions and were not previously vaccinated.  Pneumococcal polysaccharide (PPSV23) vaccine. You may need one or two doses if you smoke cigarettes or if you have certain conditions. Talk to your health care provider about which screenings and vaccines you need and how often you need them. This information is not intended to replace advice given to you by your health care provider. Make sure you discuss any questions you have with your health care provider. Document Released: 10/30/2015 Document Revised: 06/22/2016 Document Reviewed: 08/04/2015 Elsevier Interactive Patient Education  2017 [place] Prevention in the Home Falls can cause injuries. They can happen to people of all ages. There are many things you can do to make your home safe and to help prevent falls. What can I do on the outside of my home?  Regularly fix the edges of walkways and driveways and fix any cracks.  Remove anything that might make you trip as you walk through a door, such as a raised step or threshold.  Trim any bushes or trees on the path to your home.  Use bright outdoor lighting.  Clear any walking paths of anything that might make someone trip, such as rocks or tools.  Regularly check to see if handrails are loose or broken. Make sure that both sides of any steps have handrails.  Any raised decks and porches should have guardrails on the edges.  Have any leaves, snow, or ice cleared regularly.  Use sand or salt on walking paths during winter.  Clean up any spills in your garage right away. This includes oil or grease spills. What can I do in the bathroom?  Use night  lights.  Install grab bars by the toilet and in the tub and shower. Do not use towel bars as grab bars.  Use non-skid mats or decals in the tub or shower.  If you need to sit down in the shower, use a plastic, non-slip stool.  Keep the floor dry. Clean up any water that spills on the floor as soon as it happens.  Remove soap buildup in the tub or shower regularly.  Attach bath mats securely with double-sided non-slip rug tape.  Do not have throw rugs and other things on the floor that can make you trip. What can I do in the bedroom?  Use night lights.  Make sure that you have a light by your bed that is easy to reach.  Do not use any sheets or blankets that are too big for your bed. They should not hang down onto the floor.  Have a firm chair that has side arms. You can use this for support while you get dressed.  Do not have throw rugs and other things on the floor that can make you trip. What can I do in the kitchen?  Clean up any spills right away.  Avoid walking on wet floors.  Keep items that you use a lot in easy-to-reach places.  If you need to reach something above you, use a strong step stool that has a grab bar.  Keep electrical cords out of the way.  Do not use floor polish or wax that makes floors slippery. If you must use wax, use non-skid floor wax.  Do not have throw rugs and other things on the floor that can make you trip. What can I do with my stairs?  Do not leave any items on the stairs.  Make sure that there are handrails on both sides of the stairs and use them. Fix handrails that are broken or loose. Make sure that handrails are as long as the stairways.  Check any carpeting to make sure that it is firmly attached to the stairs. Fix any carpet that is loose or worn.  Avoid having throw rugs at the top or bottom of the stairs. If you do have throw rugs, attach them to the floor with carpet tape.  Make sure that you have a light switch at the  top of the stairs and the bottom of the stairs. If you do not have them, ask someone to add them for you. What else can I do to help prevent falls?  Wear shoes that:  Do not have high heels.  Have rubber bottoms.  Are comfortable and fit you well.  Are closed at the toe. Do not wear sandals.  If you use a stepladder:  Make sure that it is fully opened. Do not climb a closed stepladder.  Make sure that both sides of the stepladder are locked into place.  Ask someone to hold it for you, if possible.  Clearly mark and make sure that you can see:  Any grab bars or handrails.  First and last steps.  Where the edge of each step is.  Use tools that help you move around (mobility aids) if they are needed. These include:  Canes.  Walkers.  Scooters.  Crutches.  Turn on the lights when you go into a dark area. Replace any light bulbs as soon as they burn out.  Set up your furniture so you have a clear path. Avoid moving your furniture around.  If any of your floors are uneven, fix them.  If there are any pets around you, be aware of where they are.  Review your medicines with your doctor. Some medicines can make you feel dizzy. This can increase your chance of falling. Ask your doctor what other things that you can do to help prevent falls. This information is not intended to replace advice given to you by your health care provider. Make sure you discuss any questions you have with your health care provider. Document Released: 07/30/2009 Document Revised: 03/10/2016 Document Reviewed: 11/07/2014 Elsevier Interactive Patient Education  2017 [employer].

## 2017-03-15 NOTE — Addendum Note (Signed)
Addended by: Annetta Maw E on: 03/15/2017 11:23 AM   Modules accepted: Level of Service

## 2018-05-29 ENCOUNTER — Other Ambulatory Visit: Payer: Self-pay | Admitting: Gastroenterology

## 2018-05-29 DIAGNOSIS — R1013 Epigastric pain: Secondary | ICD-10-CM

## 2018-06-05 ENCOUNTER — Ambulatory Visit
Admission: RE | Admit: 2018-06-05 | Discharge: 2018-06-05 | Disposition: A | Discharge status: Home / Self Care | Payer: Medicare Other | Source: Ambulatory Visit | Attending: Gastroenterology | Admitting: Gastroenterology

## 2018-06-05 DIAGNOSIS — R1013 Epigastric pain: Secondary | ICD-10-CM

## 2018-06-05 MED ORDER — IOPAMIDOL (ISOVUE-300) INJECTION 61%
125.0000 mL | Freq: Once | INTRAVENOUS | Status: AC | PRN
  Administered 2018-06-05: 125 mL via INTRAVENOUS

## 2018-06-07 ENCOUNTER — Other Ambulatory Visit: Payer: Self-pay | Admitting: Gastroenterology

## 2018-06-07 DIAGNOSIS — K639 Disease of intestine, unspecified: Secondary | ICD-10-CM

## 2018-06-07 DIAGNOSIS — R935 Abnormal findings on diagnostic imaging of other abdominal regions, including retroperitoneum: Secondary | ICD-10-CM

## 2018-06-28 ENCOUNTER — Ambulatory Visit
Admission: RE | Admit: 2018-06-28 | Discharge: 2018-06-28 | Disposition: A | Discharge status: Home / Self Care | Payer: Medicare Other | Source: Ambulatory Visit | Attending: Gastroenterology | Admitting: Gastroenterology

## 2018-06-28 ENCOUNTER — Other Ambulatory Visit: Payer: Self-pay | Admitting: Gastroenterology

## 2018-06-28 DIAGNOSIS — R935 Abnormal findings on diagnostic imaging of other abdominal regions, including retroperitoneum: Secondary | ICD-10-CM

## 2018-06-28 DIAGNOSIS — K639 Disease of intestine, unspecified: Secondary | ICD-10-CM

## 2019-03-27 ENCOUNTER — Encounter (HOSPITAL_COMMUNITY): Payer: Self-pay | Admitting: *Deleted

## 2019-03-27 ENCOUNTER — Other Ambulatory Visit: Payer: Self-pay

## 2019-03-27 ENCOUNTER — Emergency Department (HOSPITAL_COMMUNITY): Payer: Medicare Other

## 2019-03-27 ENCOUNTER — Emergency Department (HOSPITAL_COMMUNITY)
Admission: EM | Admit: 2019-03-27 | Discharge: 2019-03-27 | Disposition: A | Discharge status: Home / Self Care | Payer: Medicare Other | Source: Home / Self Care | Attending: Emergency Medicine | Admitting: Emergency Medicine

## 2019-03-27 DIAGNOSIS — J9621 Acute and chronic respiratory failure with hypoxia: Secondary | ICD-10-CM | POA: Diagnosis not present

## 2019-03-27 DIAGNOSIS — F17210 Nicotine dependence, cigarettes, uncomplicated: Secondary | ICD-10-CM | POA: Insufficient documentation

## 2019-03-27 DIAGNOSIS — E039 Hypothyroidism, unspecified: Secondary | ICD-10-CM | POA: Insufficient documentation

## 2019-03-27 DIAGNOSIS — U071 COVID-19: Secondary | ICD-10-CM | POA: Diagnosis not present

## 2019-03-27 DIAGNOSIS — J441 Chronic obstructive pulmonary disease with (acute) exacerbation: Secondary | ICD-10-CM

## 2019-03-27 DIAGNOSIS — Z7982 Long term (current) use of aspirin: Secondary | ICD-10-CM | POA: Insufficient documentation

## 2019-03-27 DIAGNOSIS — I5022 Chronic systolic (congestive) heart failure: Secondary | ICD-10-CM | POA: Insufficient documentation

## 2019-03-27 DIAGNOSIS — Z7984 Long term (current) use of oral hypoglycemic drugs: Secondary | ICD-10-CM | POA: Insufficient documentation

## 2019-03-27 DIAGNOSIS — E1151 Type 2 diabetes mellitus with diabetic peripheral angiopathy without gangrene: Secondary | ICD-10-CM | POA: Insufficient documentation

## 2019-03-27 DIAGNOSIS — Z79899 Other long term (current) drug therapy: Secondary | ICD-10-CM | POA: Insufficient documentation

## 2019-03-27 LAB — BASIC METABOLIC PANEL
Anion gap: 13 (ref 5–15)
BUN: 18 mg/dL (ref 6–20)
CO2: 39 mmol/L — ABNORMAL HIGH (ref 22–32)
Calcium: 8.6 mg/dL — ABNORMAL LOW (ref 8.9–10.3)
Chloride: 90 mmol/L — ABNORMAL LOW (ref 98–111)
Creatinine, Ser: 0.51 mg/dL (ref 0.44–1.00)
GFR calc Af Amer: >60 mL/min (ref >60)
GFR calc non Af Amer: >60 mL/min (ref >60)
Glucose, Bld: 109 mg/dL — ABNORMAL HIGH (ref 70–99)
Potassium: 4.3 mmol/L (ref 3.5–5.1)
Sodium: 142 mmol/L (ref 135–145)

## 2019-03-27 LAB — CBC WITH DIFFERENTIAL/PLATELET
Abs Immature Granulocytes: 0.13 10*3/uL — ABNORMAL HIGH (ref 0.00–0.07)
Basophils Absolute: 0 10*3/uL (ref 0.0–0.1)
Basophils Relative: 0 %
Eosinophils Absolute: 0 10*3/uL (ref 0.0–0.5)
Eosinophils Relative: 0 %
HCT: 42.8 % (ref 36.0–46.0)
Hemoglobin: 11.7 g/dL — ABNORMAL LOW (ref 12.0–15.0)
Immature Granulocytes: 2 %
Lymphocytes Relative: 11 %
Lymphs Abs: 0.9 10*3/uL (ref 0.7–4.0)
MCH: 26.5 pg (ref 26.0–34.0)
MCHC: 27.3 g/dL — ABNORMAL LOW (ref 30.0–36.0)
MCV: 97.1 fL (ref 80.0–100.0)
Monocytes Absolute: 0.6 10*3/uL (ref 0.1–1.0)
Monocytes Relative: 7 %
Neutro Abs: 6.8 10*3/uL (ref 1.7–7.7)
Neutrophils Relative %: 80 %
Platelets: 231 10*3/uL (ref 150–400)
RBC: 4.41 MIL/uL (ref 3.87–5.11)
RDW: 13.6 % (ref 11.5–15.5)
WBC: 8.5 10*3/uL (ref 4.0–10.5)
nRBC: 0.2 % (ref 0.0–0.2)

## 2019-03-27 MED ORDER — PREDNISONE 20 MG PO TABS: ORAL_TABLET | ORAL | 0 refills | Status: DC

## 2019-03-27 NOTE — ED Triage Notes (Signed)
Pt BIB EMS and presents from [facility].  Pt was dx with pneumonia "a while ago" but was recently tested positive for COVID-19.  This morning staff found pt to be SOB and had O2 sats in the low 80s.  Pt was given her normal albuterol tx immediately after her EMS arrived.  On EMS arrival, pt was found to have O2 sats of 99% on 2L.  Pt is at baseline on 2L [place] for her COPD. Pt is SOB but not any more than her normal. Hx of intellectual delays but was able to answer question appropriately for EMS.

## 2019-03-27 NOTE — ED Notes (Signed)
3 unsuccessful attempts to obtain blood by this RN.

## 2019-03-27 NOTE — ED Notes (Signed)
Bed: WA08 Expected date:  Expected time:  Means of arrival:  Comments: EMS/COVID + shob

## 2019-03-27 NOTE — Discharge Instructions (Signed)
Use your treatment  every 4 hours while awake, return for sudden worsening shortness of breath, or if you need to use your inhaler more often.

## 2019-03-27 NOTE — ED Provider Notes (Signed)
[facility]-EMERGENCY DEPT Provider Note   CSN: [id number] Arrival date & time: 03/27/19  1320    History   Chief Complaint Chief Complaint  Patient presents with  . Shortness of Breath    COVID+    HPI Meghan Patterson is a 58 y.o. female.     58 yo F with a chief complaint of shortness of breath.  The patient has been recently diagnosed with the novel coronavirus.  She has been in a rehab facility.  Today there was some noted shortness of breath and she was found to have an oxygen saturation in the low 80s on her chronic 2 L of oxygen.  She was giving a nebulizer treatment with improvement of her breathing status and no longer had hypoxia.  Patient had felt better at that time but the decision was made to transport her to the ED to be evaluated.  The patient denies any other concerns.  She denies chest pain.  States that she has had some nausea and vomiting with this off and on.  Has been able to eat and drink though.  Denies abdominal pain.  The history is provided by the patient.  Illness  Severity:  Mild Onset quality:  Sudden Duration:  1 week Timing:  Constant Progression:  Worsening Chronicity:  New Associated symptoms: no chest pain, no congestion, no fever, no headaches, no myalgias, no nausea, no rhinorrhea, no shortness of breath, no vomiting and no wheezing     Past Medical History:  Diagnosis Date  . Depressive disorder   . Diabetes mellitus without complication (HCC)   . FTT (failure to thrive) in adult   . GERD (gastroesophageal reflux disease)   . Heart failure (HCC)   . Hypercholesteremia   . Hypertension   . Hypothyroid   . Hypoxemia   . Intellectual disability   . Magnesium deficiency   . Rheumatoid arthritis [facility](HCC)     Patient Active Problem List   Diagnosis Date Noted  . Chronic respiratory failure with hypoxia (HCC) 09/30/2016  . Chronic acquired lymphedema 09/30/2016  . Moderate intellectual disabilities 07/14/2016  .  PVD (peripheral vascular disease) (HCC) 07/14/2016  . Vitamin D deficiency 06/15/2016  . Dry eyes 06/15/2016  . Rheumatoid arthritis, unspecified (HCC) 12/21/2015  . Major depression, chronic 10/26/2015  . Chronic systolic heart failure (HCC) 10/26/2015  . Acute on chronic systolic CHF (congestive heart failure) (HCC) 07/17/2015  . GERD (gastroesophageal reflux disease) 07/17/2015  . HTN (hypertension) 07/17/2015  . Hypothyroid 07/17/2015  . Type 2 diabetes mellitus with peripheral vascular disease (HCC) 07/17/2015  . HLD (hyperlipidemia) 07/17/2015    Past Surgical History:  Procedure Laterality Date  . ABDOMINAL HYSTERECTOMY       OB History   No obstetric history on file.      Home Medications    Prior to Admission medications   Medication Sig Start Date End Date Taking? Authorizing Provider  acetaminophen (TYLENOL) 325 MG tablet Take 650 mg by mouth every 8 (eight) hours as needed for fever or headache (or muscle pain).     [provider]  acetaminophen (TYLENOL) 500 MG tablet Take 1,000 mg by mouth 2 (two) times daily.    [provider]  aspirin 81 MG chewable tablet Chew 81 mg by mouth daily.    [provider]  calcium carbonate (TUMS - DOSED IN MG ELEMENTAL CALCIUM) 500 MG chewable tablet Chew 1 tablet by mouth 2 (two) times daily as needed for indigestion  or heartburn.    [provider]  Cholecalciferol (VITAMIN D3) 2000 units TABS Take 1 tablet by mouth daily.    [provider]  citalopram (CELEXA) 10 MG tablet Take 10 mg by mouth daily.    [provider]  Cranberry 250 MG CAPS Take 250 mg by mouth 2 (two) times daily.    [provider]  estradiol (ESTRACE) 0.1 MG/GM vaginal cream Place 1 Applicatorful vaginally 3 (three) times a week. Monday, Wednesday and Friday    [provider]  fentaNYL (DURAGESIC - DOSED MCG/HR) 12 MCG/HR Apply one patch every 72 hours for pain. Remove old patch.  External Use. Rotate sites 02/17/16   Kirt Boysarter, Monica, DO  folic acid (FOLVITE) 1 MG tablet Take 1 mg by mouth daily.     [provider]  furosemide (LASIX) 20 MG tablet Take 1 tablet (20 mg total) by mouth daily. 07/21/15   Katha HammingKonidena, Snehalatha, MD  ipratropium-albuterol (DUONEB) 0.5-2.5 (3) MG/3ML SOLN Take 3 mLs by nebulization every 6 (six) hours as needed.    [provider]  levothyroxine (SYNTHROID, LEVOTHROID) 150 MCG tablet Take 150 mcg by mouth every morning.     [provider]  loratadine (CLARITIN) 10 MG tablet Take 10 mg by mouth daily.     [provider]  losartan (COZAAR) 100 MG tablet Take 100 mg by mouth daily.    [provider]  Menthol, Topical Analgesic, (BIOFREEZE) 4 % GEL Apply 1 application topically every 6 (six) hours as needed.    [provider]  menthol-cetylpyridinium (CEPACOL) 3 MG lozenge Take 1 lozenge by mouth every 6 (six) hours as needed for sore throat.    [provider]  metFORMIN (GLUCOPHAGE) 1000 MG tablet Take 500 mg by mouth 2 (two) times daily with a meal.     [provider]  metoprolol tartrate (LOPRESSOR) 25 MG tablet Take 1 tablet (25 mg total) by mouth 2 (two) times daily. 07/21/15   Katha HammingKonidena, Snehalatha, MD  montelukast (SINGULAIR) 10 MG tablet Take 10 mg by mouth daily.     [provider]  mupirocin ointment (BACTROBAN) 2 % Place 1 application into the nose daily. Apply to right lower extremity    [provider]  OXYGEN Inhale 2 L into the lungs continuous.    [provider]  polyethylene glycol (MIRALAX / GLYCOLAX) packet Take 17 g by mouth daily.    [provider]  predniSONE (DELTASONE) 20 MG tablet 2 tabs po daily x 4 days 03/27/19   Melene PlanFloyd, Sundee Garland, DO  Propylene Glycol (SYSTANE BALANCE) 0.6 % SOLN Apply 1 drop to eye 2 (two) times daily.    [provider]  ranitidine (ZANTAC) 75 MG tablet Take 75 mg by mouth daily.    [provider]  sennosides-docusate sodium (SENOKOT-S) 8.6-50 MG tablet Take 2 tablets by mouth at bedtime.    [provider]  vitamin B-12 (CYANOCOBALAMIN) 1000 MCG tablet Take 1,000 mcg by mouth daily.    [provider]  witch hazel-glycerin (TUCKS) pad Apply 1 application topically 3 (three) times daily as needed for itching. Apply to external rectum    [provider]  zinc oxide (BALMEX) 11.3 % CREA cream Apply 1 application topically as needed.    [provider]    Family History Family History  Problem Relation Age of Onset  . Diabetes Mother   . Hypertension Father   . Emphysema Father     Social  History Social History   Tobacco Use  . Smoking status: Never Smoker  . Smokeless tobacco: Never Used  Substance Use Topics  . Alcohol use: No    Alcohol/week: 0.0 standard drinks  . Drug use: No     Allergies   Bee venom   Review of Systems Review of Systems  Constitutional: Negative for chills and fever.  HENT: Negative for congestion and rhinorrhea.   Eyes: Negative for redness and visual disturbance.  Respiratory: Negative for shortness of breath and wheezing.   Cardiovascular: Negative for chest pain and palpitations.  Gastrointestinal: Negative for nausea and vomiting.  Genitourinary: Negative for dysuria and urgency.  Musculoskeletal: Negative for arthralgias and myalgias.  Skin: Negative for pallor and wound.  Neurological: Negative for dizziness and headaches.     Physical Exam Updated Vital Signs BP (!) 127/91   Pulse 90   Temp 98.5 F (36.9 C) (Oral)   Resp 17   SpO2 100%   Physical Exam Vitals signs and nursing note reviewed.  Constitutional:      General: She is not in acute distress.    Appearance: She is well-developed. She is not diaphoretic.  HENT:     Head: Normocephalic and atraumatic.  Eyes:     Pupils: Pupils are equal, round, and reactive to light.  Neck:     Musculoskeletal: Normal range of  motion and neck supple.  Cardiovascular:     Rate and Rhythm: Normal rate and regular rhythm.     Heart sounds: No murmur. No friction rub. No gallop.   Pulmonary:     Effort: Pulmonary effort is normal.     Breath sounds: No wheezing or rales.     Comments: Diminished breath sounds Abdominal:     General: There is no distension.     Palpations: Abdomen is soft.     Tenderness: There is no abdominal tenderness.  Musculoskeletal:        General: No tenderness.  Skin:    General: Skin is warm and dry.  Neurological:     Mental Status: She is alert and oriented to person, place, and time.  Psychiatric:        Behavior: Behavior normal.      ED Treatments / Results  Labs (all labs ordered are listed, but only abnormal results are displayed) Labs Reviewed  CBC WITH DIFFERENTIAL/PLATELET - Abnormal; Notable for the following components:      Result Value   Hemoglobin 11.7 (*)    MCHC 27.3 (*)    Abs Immature Granulocytes 0.13 (*)    All other components within normal limits  BASIC METABOLIC PANEL - Abnormal; Notable for the following components:   Chloride 90 (*)    CO2 39 (*)    Glucose, Bld 109 (*)    Calcium 8.6 (*)    All other components within normal limits    EKG EKG Interpretation  Date/Time:  Wednesday March 27 2019 14:35:35 EDT Ventricular Rate:  88 PR Interval:    QRS Duration: 133 QT Interval:  382 QTC Calculation: 463 R Axis:     Text Interpretation:  Sinus rhythm Right bundle branch block Probable left ventricular hypertrophy Baseline wander in lead(s) I III aVL aVF V3 No significant change since last tracing Confirmed by Deno Etienne [phone]) on 03/27/2019 3:21:18 PM   Radiology Dg Chest Port 1 View  Result Date: 03/27/2019 CLINICAL DATA:  Dyspnea EXAM: PORTABLE CHEST 1 VIEW COMPARISON:  07/17/2015 chest radiograph. FINDINGS: Low lung volumes. Stable  cardiomediastinal silhouette with normal heart size. No pneumothorax. Probable small bilateral pleural  effusions. Patchy bibasilar lung opacities. IMPRESSION: Low lung volumes with patchy bibasilar lung opacities, which could represent pneumonia, aspiration and/or atelectasis. Probable small bilateral pleural effusions. Electronically Signed   By: Delbert Phenix M.D.   On: 03/27/2019 14:20    Procedures Procedures (including critical care time)  Medications Ordered in ED Medications - No data to display   Initial Impression / Assessment and Plan / ED Course  I have reviewed the triage vital signs and the nursing notes.  Pertinent labs & imaging results that were available during my care of the patient were reviewed by me and considered in my medical decision making (see chart for details).        58 yo F with a cc of sob.  Patient has a history of COPD and had significant improvement with her breathing treatment.  Likely this is a COPD exacerbation on top of her coronavirus.  She states that her symptoms are completely better.  Will obtain a chest x-ray lab work and monitor in the ED for a short course.  Lab work is without significant finding, no significant change in her baseline hemoglobin renal function seems to be at baseline no significant electrolyte abnormality.  Chest x-ray viewed by me without focal infiltrate.  She continues to oxygenate well on her home 2 L.  We will discharge her home.  PCP follow-up.  Burst dose steroids.  5:28 PM:  I have discussed the diagnosis/risks/treatment options with the patient and believe the pt to be eligible for discharge home to follow-up with PCP. We also discussed returning to the ED immediately if new or worsening sx occur. We discussed the sx which are most concerning (e.g., sudden worsening sob, fever, inability to tolerate by mouth) that necessitate immediate return. Medications administered to the patient during their visit and any new prescriptions provided to the patient are listed below.  Medications given during this visit Medications - No  data to display   The patient appears reasonably screen and/or stabilized for discharge and I doubt any other medical condition or other [facility] requiring further screening, evaluation, or treatment in the ED at this time prior to discharge.    Final Clinical Impressions(s) / ED Diagnoses   Final diagnoses:  COPD exacerbation [facility])    ED Discharge Orders         Ordered    predniSONE (DELTASONE) 20 MG tablet     03/27/19 1700           Melene Plan, DO 03/27/19 1728

## 2019-03-28 ENCOUNTER — Emergency Department (HOSPITAL_COMMUNITY): Payer: Medicare Other

## 2019-03-28 ENCOUNTER — Encounter (HOSPITAL_COMMUNITY): Payer: Self-pay | Admitting: Emergency Medicine

## 2019-03-28 ENCOUNTER — Inpatient Hospital Stay (HOSPITAL_COMMUNITY)
Admission: EM | Admit: 2019-03-28 | Discharge: 2019-04-02 | DRG: 177 | Disposition: A | Discharge status: Skilled Nursing Facility | Payer: Medicare Other | Source: Skilled Nursing Facility | Attending: Internal Medicine | Admitting: Internal Medicine

## 2019-03-28 DIAGNOSIS — I110 Hypertensive heart disease with heart failure: Secondary | ICD-10-CM | POA: Diagnosis present

## 2019-03-28 DIAGNOSIS — Z825 Family history of asthma and other chronic lower respiratory diseases: Secondary | ICD-10-CM

## 2019-03-28 DIAGNOSIS — Z833 Family history of diabetes mellitus: Secondary | ICD-10-CM | POA: Diagnosis not present

## 2019-03-28 DIAGNOSIS — F329 Major depressive disorder, single episode, unspecified: Secondary | ICD-10-CM | POA: Diagnosis present

## 2019-03-28 DIAGNOSIS — Z7984 Long term (current) use of oral hypoglycemic drugs: Secondary | ICD-10-CM | POA: Diagnosis not present

## 2019-03-28 DIAGNOSIS — M069 Rheumatoid arthritis, unspecified: Secondary | ICD-10-CM | POA: Diagnosis present

## 2019-03-28 DIAGNOSIS — Z9981 Dependence on supplemental oxygen: Secondary | ICD-10-CM

## 2019-03-28 DIAGNOSIS — J1289 Other viral pneumonia: Secondary | ICD-10-CM | POA: Diagnosis present

## 2019-03-28 DIAGNOSIS — Z79890 Hormone replacement therapy (postmenopausal): Secondary | ICD-10-CM

## 2019-03-28 DIAGNOSIS — I248 Other forms of acute ischemic heart disease: Secondary | ICD-10-CM | POA: Diagnosis present

## 2019-03-28 DIAGNOSIS — J9621 Acute and chronic respiratory failure with hypoxia: Secondary | ICD-10-CM | POA: Diagnosis present

## 2019-03-28 DIAGNOSIS — I5032 Chronic diastolic (congestive) heart failure: Secondary | ICD-10-CM | POA: Diagnosis not present

## 2019-03-28 DIAGNOSIS — E875 Hyperkalemia: Secondary | ICD-10-CM | POA: Diagnosis not present

## 2019-03-28 DIAGNOSIS — I4510 Unspecified right bundle-branch block: Secondary | ICD-10-CM | POA: Diagnosis present

## 2019-03-28 DIAGNOSIS — E785 Hyperlipidemia, unspecified: Secondary | ICD-10-CM | POA: Diagnosis present

## 2019-03-28 DIAGNOSIS — J9611 Chronic respiratory failure with hypoxia: Secondary | ICD-10-CM | POA: Diagnosis present

## 2019-03-28 DIAGNOSIS — E039 Hypothyroidism, unspecified: Secondary | ICD-10-CM | POA: Diagnosis present

## 2019-03-28 DIAGNOSIS — U071 COVID-19: Secondary | ICD-10-CM | POA: Diagnosis present

## 2019-03-28 DIAGNOSIS — F71 Moderate intellectual disabilities: Secondary | ICD-10-CM | POA: Diagnosis not present

## 2019-03-28 DIAGNOSIS — E1151 Type 2 diabetes mellitus with diabetic peripheral angiopathy without gangrene: Secondary | ICD-10-CM | POA: Diagnosis present

## 2019-03-28 DIAGNOSIS — J440 Chronic obstructive pulmonary disease with acute lower respiratory infection: Secondary | ICD-10-CM | POA: Diagnosis present

## 2019-03-28 DIAGNOSIS — J069 Acute upper respiratory infection, unspecified: Secondary | ICD-10-CM | POA: Diagnosis not present

## 2019-03-28 DIAGNOSIS — I10 Essential (primary) hypertension: Secondary | ICD-10-CM | POA: Diagnosis present

## 2019-03-28 DIAGNOSIS — Z79899 Other long term (current) drug therapy: Secondary | ICD-10-CM

## 2019-03-28 DIAGNOSIS — R0602 Shortness of breath: Secondary | ICD-10-CM

## 2019-03-28 DIAGNOSIS — G8929 Other chronic pain: Secondary | ICD-10-CM | POA: Diagnosis present

## 2019-03-28 DIAGNOSIS — K219 Gastro-esophageal reflux disease without esophagitis: Secondary | ICD-10-CM | POA: Diagnosis present

## 2019-03-28 DIAGNOSIS — E7800 Pure hypercholesterolemia, unspecified: Secondary | ICD-10-CM | POA: Diagnosis present

## 2019-03-28 DIAGNOSIS — E612 Magnesium deficiency: Secondary | ICD-10-CM | POA: Diagnosis present

## 2019-03-28 DIAGNOSIS — Z8249 Family history of ischemic heart disease and other diseases of the circulatory system: Secondary | ICD-10-CM | POA: Diagnosis not present

## 2019-03-28 DIAGNOSIS — Z7951 Long term (current) use of inhaled steroids: Secondary | ICD-10-CM

## 2019-03-28 DIAGNOSIS — Z66 Do not resuscitate: Secondary | ICD-10-CM | POA: Diagnosis present

## 2019-03-28 LAB — HEMOGLOBIN A1C
Hgb A1c MFr Bld: 5.9 % — ABNORMAL HIGH (ref 4.8–5.6)
Mean Plasma Glucose: 122.63 mg/dL

## 2019-03-28 LAB — CBC
HCT: 40.3 % (ref 36.0–46.0)
Hemoglobin: 11.3 g/dL — ABNORMAL LOW (ref 12.0–15.0)
MCH: 26.8 pg (ref 26.0–34.0)
MCHC: 28 g/dL — ABNORMAL LOW (ref 30.0–36.0)
MCV: 95.5 fL (ref 80.0–100.0)
Platelets: 246 10*3/uL (ref 150–400)
RBC: 4.22 MIL/uL (ref 3.87–5.11)
RDW: 13.9 % (ref 11.5–15.5)
WBC: 10.1 10*3/uL (ref 4.0–10.5)
nRBC: 0.2 % (ref 0.0–0.2)

## 2019-03-28 LAB — COMPREHENSIVE METABOLIC PANEL
ALT: 8 U/L (ref 0–44)
AST: 27 U/L (ref 15–41)
Albumin: 3 g/dL — ABNORMAL LOW (ref 3.5–5.0)
Alkaline Phosphatase: 53 U/L (ref 38–126)
Anion gap: 15 (ref 5–15)
BUN: 18 mg/dL (ref 6–20)
CO2: 38 mmol/L — ABNORMAL HIGH (ref 22–32)
Calcium: 8.4 mg/dL — ABNORMAL LOW (ref 8.9–10.3)
Chloride: 89 mmol/L — ABNORMAL LOW (ref 98–111)
Creatinine, Ser: 0.68 mg/dL (ref 0.44–1.00)
GFR calc Af Amer: >60 mL/min (ref >60)
GFR calc non Af Amer: >60 mL/min (ref >60)
Glucose, Bld: 160 mg/dL — ABNORMAL HIGH (ref 70–99)
Potassium: 4.2 mmol/L (ref 3.5–5.1)
Sodium: 142 mmol/L (ref 135–145)
Total Bilirubin: 0.8 mg/dL (ref 0.3–1.2)
Total Protein: 6.4 g/dL — ABNORMAL LOW (ref 6.5–8.1)

## 2019-03-28 LAB — FERRITIN: Ferritin: 213 ng/mL (ref 11–307)

## 2019-03-28 LAB — GLUCOSE, CAPILLARY
Glucose-Capillary: 116 mg/dL — ABNORMAL HIGH (ref 70–99)
Glucose-Capillary: 156 mg/dL — ABNORMAL HIGH (ref 70–99)
Glucose-Capillary: 330 mg/dL — ABNORMAL HIGH (ref 70–99)

## 2019-03-28 LAB — D-DIMER, QUANTITATIVE
D-Dimer, Quant: 2.24 ug/mL-FEU — ABNORMAL HIGH (ref 0.00–0.50)
D-Dimer, Quant: 2.11 ug/mL-FEU — ABNORMAL HIGH (ref 0.00–0.50)

## 2019-03-28 LAB — TSH: TSH: 0.609 u[IU]/mL (ref 0.350–4.500)

## 2019-03-28 LAB — PROCALCITONIN: Procalcitonin: 1.4 ng/mL

## 2019-03-28 LAB — TROPONIN I
Troponin I: 0.19 ng/mL (ref <0.03)
Troponin I: 0.2 ng/mL (ref <0.03)

## 2019-03-28 LAB — LACTATE DEHYDROGENASE: LDH: 177 U/L (ref 98–192)

## 2019-03-28 LAB — BRAIN NATRIURETIC PEPTIDE: B Natriuretic Peptide: 504.7 pg/mL — ABNORMAL HIGH (ref 0.0–100.0)

## 2019-03-28 LAB — C-REACTIVE PROTEIN: CRP: 3.7 mg/dL — ABNORMAL HIGH (ref <1.0)

## 2019-03-28 LAB — FIBRINOGEN: Fibrinogen: 459 mg/dL (ref 210–475)

## 2019-03-28 LAB — MRSA PCR SCREENING: MRSA by PCR: NEGATIVE

## 2019-03-28 MED ORDER — ACETAMINOPHEN 325 MG PO TABS
650.0000 mg | ORAL_TABLET | Freq: Four times a day (QID) | ORAL | Status: DC | PRN
  Administered 2019-03-30: 650 mg via ORAL
  Filled 2019-03-28: qty 2

## 2019-03-28 MED ORDER — PANTOPRAZOLE SODIUM 40 MG PO TBEC
40.0000 mg | DELAYED_RELEASE_TABLET | ORAL | Status: DC
  Administered 2019-03-29 – 2019-04-02 (×4): 40 mg via ORAL
  Filled 2019-03-28 (×5): qty 1

## 2019-03-28 MED ORDER — DICYCLOMINE HCL 20 MG PO TABS
20.0000 mg | ORAL_TABLET | Freq: Three times a day (TID) | ORAL | Status: DC
  Administered 2019-03-28 – 2019-04-02 (×15): 20 mg via ORAL
  Filled 2019-03-28 (×20): qty 1

## 2019-03-28 MED ORDER — SODIUM CHLORIDE 0.9 % IV SOLN
200.0000 mg | Freq: Once | INTRAVENOUS | Status: AC
  Administered 2019-03-28: 200 mg via INTRAVENOUS
  Filled 2019-03-28: qty 40

## 2019-03-28 MED ORDER — CHLORHEXIDINE GLUCONATE 0.12 % MT SOLN
15.0000 mL | Freq: Two times a day (BID) | OROMUCOSAL | Status: DC
  Administered 2019-03-28 – 2019-04-02 (×11): 15 mL via OROMUCOSAL
  Filled 2019-03-28 (×11): qty 15

## 2019-03-28 MED ORDER — BISACODYL 5 MG PO TBEC: 5.0000 mg | DELAYED_RELEASE_TABLET | Freq: Every day | ORAL | Status: DC | PRN

## 2019-03-28 MED ORDER — DOCUSATE SODIUM 100 MG PO CAPS
100.0000 mg | ORAL_CAPSULE | Freq: Two times a day (BID) | ORAL | Status: DC
  Administered 2019-03-28 – 2019-04-02 (×11): 100 mg via ORAL
  Filled 2019-03-28 (×10): qty 1

## 2019-03-28 MED ORDER — LEVOTHYROXINE SODIUM 75 MCG PO TABS
150.0000 ug | ORAL_TABLET | Freq: Every day | ORAL | Status: DC
  Administered 2019-03-30 – 2019-04-02 (×4): 150 ug via ORAL
  Filled 2019-03-28 (×4): qty 2

## 2019-03-28 MED ORDER — FLUCONAZOLE 200 MG PO TABS
100.0000 mg | ORAL_TABLET | Freq: Every day | ORAL | Status: DC
  Administered 2019-03-28 – 2019-04-02 (×6): 100 mg via ORAL
  Filled 2019-03-28 (×2): qty 0.5
  Filled 2019-03-28: qty 1
  Filled 2019-03-28 (×2): qty 0.5
  Filled 2019-03-28: qty 1
  Filled 2019-03-28 (×2): qty 0.5

## 2019-03-28 MED ORDER — CYCLOSPORINE 0.05 % OP EMUL
1.0000 [drp] | Freq: Two times a day (BID) | OPHTHALMIC | Status: DC
  Administered 2019-03-28 – 2019-04-02 (×11): 1 [drp] via OPHTHALMIC
  Filled 2019-03-28 (×13): qty 30

## 2019-03-28 MED ORDER — INSULIN ASPART 100 UNIT/ML ~~LOC~~ SOLN
0.0000 [IU] | Freq: Three times a day (TID) | SUBCUTANEOUS | Status: DC
  Administered 2019-03-28 – 2019-03-29 (×2): 3 [IU] via SUBCUTANEOUS
  Administered 2019-03-29: 13:00:00 8 [IU] via SUBCUTANEOUS
  Administered 2019-03-29: 3 [IU] via SUBCUTANEOUS
  Administered 2019-03-30: 8 [IU] via SUBCUTANEOUS
  Administered 2019-03-30: 3 [IU] via SUBCUTANEOUS
  Administered 2019-03-30: 12:00:00 8 [IU] via SUBCUTANEOUS
  Administered 2019-03-31 (×2): 3 [IU] via SUBCUTANEOUS
  Administered 2019-03-31 – 2019-04-01 (×2): 8 [IU] via SUBCUTANEOUS
  Administered 2019-04-01: 13:00:00 5 [IU] via SUBCUTANEOUS
  Administered 2019-04-02: 3 [IU] via SUBCUTANEOUS

## 2019-03-28 MED ORDER — SACCHAROMYCES BOULARDII 250 MG PO CAPS: 250.0000 mg | ORAL_CAPSULE | Freq: Every day | ORAL | Status: DC

## 2019-03-28 MED ORDER — LOSARTAN POTASSIUM 50 MG PO TABS
50.0000 mg | ORAL_TABLET | Freq: Every day | ORAL | Status: DC
  Administered 2019-03-29 – 2019-03-30 (×2): 50 mg via ORAL
  Filled 2019-03-28 (×3): qty 1

## 2019-03-28 MED ORDER — ONDANSETRON HCL 4 MG PO TABS: 4.0000 mg | ORAL_TABLET | Freq: Four times a day (QID) | ORAL | Status: DC | PRN

## 2019-03-28 MED ORDER — ENOXAPARIN SODIUM 40 MG/0.4ML ~~LOC~~ SOLN
40.0000 mg | SUBCUTANEOUS | Status: DC
  Administered 2019-03-28: 15:00:00 40 mg via SUBCUTANEOUS
  Filled 2019-03-28: qty 0.4

## 2019-03-28 MED ORDER — ALBUTEROL SULFATE HFA 108 (90 BASE) MCG/ACT IN AERS
2.0000 | INHALATION_SPRAY | RESPIRATORY_TRACT | Status: DC | PRN
  Administered 2019-04-01: 2 via RESPIRATORY_TRACT
  Filled 2019-03-28: qty 6.7

## 2019-03-28 MED ORDER — FENTANYL 12 MCG/HR TD PT72
1.0000 | MEDICATED_PATCH | TRANSDERMAL | Status: DC
  Administered 2019-03-28 – 2019-03-31 (×2): 1 via TRANSDERMAL
  Filled 2019-03-28 (×2): qty 1

## 2019-03-28 MED ORDER — MELATONIN 5 MG PO TABS
5.0000 mg | ORAL_TABLET | Freq: Every day | ORAL | Status: DC
  Administered 2019-03-28 – 2019-04-01 (×5): 5 mg via ORAL
  Filled 2019-03-28 (×6): qty 1

## 2019-03-28 MED ORDER — ORAL CARE MOUTH RINSE
15.0000 mL | Freq: Two times a day (BID) | OROMUCOSAL | Status: DC
  Administered 2019-03-28 – 2019-04-01 (×9): 15 mL via OROMUCOSAL

## 2019-03-28 MED ORDER — TRAZODONE HCL 50 MG PO TABS
50.0000 mg | ORAL_TABLET | Freq: Every day | ORAL | Status: DC
  Administered 2019-03-28 – 2019-04-01 (×5): 50 mg via ORAL
  Filled 2019-03-28 (×5): qty 1

## 2019-03-28 MED ORDER — SODIUM CHLORIDE 0.9 % IV SOLN
100.0000 mg | INTRAVENOUS | Status: AC
  Administered 2019-03-29 – 2019-04-01 (×4): 100 mg via INTRAVENOUS
  Filled 2019-03-28 (×4): qty 20

## 2019-03-28 MED ORDER — GERHARDT'S BUTT CREAM
TOPICAL_CREAM | Freq: Three times a day (TID) | CUTANEOUS | Status: DC
  Administered 2019-03-28 – 2019-03-30 (×6): via TOPICAL
  Administered 2019-03-30: 1 via TOPICAL
  Administered 2019-03-31 (×3): via TOPICAL
  Administered 2019-04-01: 1 via TOPICAL
  Administered 2019-04-01: 22:00:00 via TOPICAL
  Administered 2019-04-01: 1 via TOPICAL
  Administered 2019-04-02: 11:00:00 via TOPICAL
  Filled 2019-03-28 (×2): qty 1

## 2019-03-28 MED ORDER — CITALOPRAM HYDROBROMIDE 10 MG PO TABS
10.0000 mg | ORAL_TABLET | Freq: Every day | ORAL | Status: DC
  Administered 2019-03-28 – 2019-04-02 (×6): 10 mg via ORAL
  Filled 2019-03-28 (×7): qty 1

## 2019-03-28 MED ORDER — POLYETHYLENE GLYCOL 3350 17 G PO PACK: 17.0000 g | PACK | Freq: Every day | ORAL | Status: DC | PRN

## 2019-03-28 MED ORDER — MOMETASONE FURO-FORMOTEROL FUM 200-5 MCG/ACT IN AERO
2.0000 | INHALATION_SPRAY | Freq: Two times a day (BID) | RESPIRATORY_TRACT | Status: DC
  Filled 2019-03-28: qty 8.8

## 2019-03-28 MED ORDER — RISAQUAD PO CAPS
1.0000 | ORAL_CAPSULE | Freq: Every day | ORAL | Status: DC
  Administered 2019-03-28 – 2019-04-02 (×6): 1 via ORAL
  Filled 2019-03-28 (×6): qty 1

## 2019-03-28 MED ORDER — SODIUM CHLORIDE 0.9 % IV BOLUS
500.0000 mL | Freq: Once | INTRAVENOUS | Status: AC
  Administered 2019-03-28: 500 mL via INTRAVENOUS

## 2019-03-28 MED ORDER — METHYLPREDNISOLONE SODIUM SUCC 125 MG IJ SOLR
60.0000 mg | Freq: Two times a day (BID) | INTRAMUSCULAR | Status: DC
  Administered 2019-03-28 (×2): 60 mg via INTRAVENOUS
  Filled 2019-03-28 (×2): qty 2

## 2019-03-28 MED ORDER — FUROSEMIDE 20 MG PO TABS
20.0000 mg | ORAL_TABLET | Freq: Every day | ORAL | Status: DC
  Administered 2019-03-29 – 2019-03-30 (×2): 20 mg via ORAL
  Filled 2019-03-28 (×2): qty 1

## 2019-03-28 MED ORDER — OXYCODONE HCL 5 MG PO TABS
5.0000 mg | ORAL_TABLET | ORAL | Status: DC | PRN
  Administered 2019-03-30 (×2): 5 mg via ORAL
  Filled 2019-03-28 (×2): qty 1

## 2019-03-28 MED ORDER — FUROSEMIDE 10 MG/ML IJ SOLN
20.0000 mg | Freq: Once | INTRAMUSCULAR | Status: AC
  Administered 2019-03-28: 20 mg via INTRAVENOUS
  Filled 2019-03-28: qty 2

## 2019-03-28 MED ORDER — METOPROLOL SUCCINATE ER 25 MG PO TB24
25.0000 mg | ORAL_TABLET | Freq: Every day | ORAL | Status: DC
  Administered 2019-03-29 – 2019-04-02 (×5): 25 mg via ORAL
  Filled 2019-03-28 (×6): qty 1

## 2019-03-28 MED ORDER — INSULIN ASPART 100 UNIT/ML ~~LOC~~ SOLN
0.0000 [IU] | Freq: Every day | SUBCUTANEOUS | Status: DC
  Administered 2019-03-28: 4 [IU] via SUBCUTANEOUS
  Administered 2019-03-29: 3 [IU] via SUBCUTANEOUS
  Administered 2019-03-31: 2 [IU] via SUBCUTANEOUS

## 2019-03-28 MED ORDER — ATORVASTATIN CALCIUM 40 MG PO TABS
80.0000 mg | ORAL_TABLET | Freq: Every day | ORAL | Status: DC
  Administered 2019-03-28 – 2019-04-01 (×5): 80 mg via ORAL
  Filled 2019-03-28 (×5): qty 2

## 2019-03-28 MED ORDER — ONDANSETRON HCL 4 MG/2ML IJ SOLN
4.0000 mg | Freq: Four times a day (QID) | INTRAMUSCULAR | Status: DC | PRN
  Filled 2019-03-28: qty 2

## 2019-03-28 MED ORDER — MOMETASONE FURO-FORMOTEROL FUM 200-5 MCG/ACT IN AERO
2.0000 | INHALATION_SPRAY | Freq: Two times a day (BID) | RESPIRATORY_TRACT | Status: DC
  Administered 2019-03-28 – 2019-04-02 (×10): 2 via RESPIRATORY_TRACT
  Filled 2019-03-28: qty 8.8

## 2019-03-28 NOTE — ED Provider Notes (Signed)
[facility] EMERGENCY DEPARTMENT Provider Note   CSN: [id number] Arrival date & time: 03/28/19  0047     History   Chief Complaint Chief Complaint  Patient presents with  . Shortness of Breath    HPI Meghan Patterson is a 58 y.o. female.     Patient is a 58 year old female with past medical history of diabetes, CHF, hypertension, and intellectual disability.  She is brought for evaluation of shortness of breath.  Patient was diagnosed with COVID-19 earlier this week and has had increased difficulty breathing.  She was seen at [facility] long yesterday, then discharged.  Her oxygen saturations today were lower and patient was transported here.  Patient has little additional history secondary to cognitive issues.  The history is provided by the patient.  Shortness of Breath Severity:  Moderate Onset quality:  Gradual Duration:  4 days Timing:  Constant Progression:  Worsening Chronicity:  New Relieved by:  Nothing Worsened by:  Nothing Ineffective treatments:  None tried   Past Medical History:  Diagnosis Date  . Depressive disorder   . Diabetes mellitus without complication ([place])   . FTT (failure to thrive) in adult   . GERD (gastroesophageal reflux disease)   . Heart failure ([place])   . Hypercholesteremia   . Hypertension   . Hypothyroid   . Hypoxemia   . Intellectual disability   . Magnesium deficiency   . Rheumatoid arthritis [facility])     Patient Active Problem List   Diagnosis Date Noted  . Chronic respiratory failure with hypoxia ([place]) 09/30/2016  . Chronic acquired lymphedema 09/30/2016  . Moderate intellectual disabilities 07/14/2016  . PVD (peripheral vascular disease) ([place]) 07/14/2016  . Vitamin D deficiency 06/15/2016  . Dry eyes 06/15/2016  . Rheumatoid arthritis, unspecified ([place]) 12/21/2015  . Major depression, chronic 10/26/2015  . Chronic systolic heart failure ([place]) 10/26/2015  . Acute on chronic systolic CHF (congestive heart  failure) ([place]) 07/17/2015  . GERD (gastroesophageal reflux disease) 07/17/2015  . HTN (hypertension) 07/17/2015  . Hypothyroid 07/17/2015  . Type 2 diabetes mellitus with peripheral vascular disease ([place]) 07/17/2015  . HLD (hyperlipidemia) 07/17/2015    Past Surgical History:  Procedure Laterality Date  . ABDOMINAL HYSTERECTOMY       OB History   No obstetric history on file.      Home Medications    Prior to Admission medications   Medication Sig Start Date End Date Taking? Authorizing Provider  acetaminophen (TYLENOL) 325 MG tablet Take 650 mg by mouth every 8 (eight) hours as needed for fever or headache (or muscle pain).     [provider]  acetaminophen (TYLENOL) 500 MG tablet Take 1,000 mg by mouth 2 (two) times daily.    [provider]  aspirin 81 MG chewable tablet Chew 81 mg by mouth daily.    [provider]  calcium carbonate (TUMS - DOSED IN MG ELEMENTAL CALCIUM) 500 MG chewable tablet Chew 1 tablet by mouth 2 (two) times daily as needed for indigestion or heartburn.    [provider]  Cholecalciferol (VITAMIN D3) 2000 units TABS Take 1 tablet by mouth daily.    [provider]  citalopram (CELEXA) 10 MG tablet Take 10 mg by mouth daily.    [provider]  Cranberry 250 MG CAPS Take 250 mg by mouth 2 (two) times daily.    [provider]  estradiol (ESTRACE) 0.1 MG/GM vaginal cream Place 1 Applicatorful vaginally 3 (three) times a week. Monday,  Wednesday and Friday    [provider]  fentaNYL (DURAGESIC - DOSED MCG/HR) 12 MCG/HR Apply one patch every 72 hours for pain. Remove old patch. External Use. Rotate sites 02/17/16   Kirt Boys, DO  folic acid (FOLVITE) 1 MG tablet Take 1 mg by mouth daily.     [provider]  furosemide (LASIX) 20 MG tablet Take 1 tablet (20 mg total) by mouth daily. 07/21/15   Katha Hamming, MD  ipratropium-albuterol (DUONEB) 0.5-2.5 (3) MG/3ML SOLN  Take 3 mLs by nebulization every 6 (six) hours as needed.    [provider]  levothyroxine (SYNTHROID, LEVOTHROID) 150 MCG tablet Take 150 mcg by mouth every morning.     [provider]  loratadine (CLARITIN) 10 MG tablet Take 10 mg by mouth daily.     [provider]  losartan (COZAAR) 100 MG tablet Take 100 mg by mouth daily.    [provider]  Menthol, Topical Analgesic, (BIOFREEZE) 4 % GEL Apply 1 application topically every 6 (six) hours as needed.    [provider]  menthol-cetylpyridinium (CEPACOL) 3 MG lozenge Take 1 lozenge by mouth every 6 (six) hours as needed for sore throat.    [provider]  metFORMIN (GLUCOPHAGE) 1000 MG tablet Take 500 mg by mouth 2 (two) times daily with a meal.     [provider]  metoprolol tartrate (LOPRESSOR) 25 MG tablet Take 1 tablet (25 mg total) by mouth 2 (two) times daily. 07/21/15   Katha Hamming, MD  montelukast (SINGULAIR) 10 MG tablet Take 10 mg by mouth daily.     [provider]  mupirocin ointment (BACTROBAN) 2 % Place 1 application into the nose daily. Apply to right lower extremity    [provider]  OXYGEN Inhale 2 L into the lungs continuous.    [provider]  polyethylene glycol (MIRALAX / GLYCOLAX) packet Take 17 g by mouth daily.    [provider]  predniSONE (DELTASONE) 20 MG tablet 2 tabs po daily x 4 days 03/27/19   Melene Plan, DO  Propylene Glycol (SYSTANE BALANCE) 0.6 % SOLN Apply 1 drop to eye 2 (two) times daily.    [provider]  ranitidine (ZANTAC) 75 MG tablet Take 75 mg by mouth daily.    [provider]  sennosides-docusate sodium (SENOKOT-S) 8.6-50 MG tablet Take 2 tablets by mouth at bedtime.    [provider]  vitamin B-12 (CYANOCOBALAMIN) 1000 MCG tablet Take 1,000 mcg by mouth daily.    [provider]  witch hazel-glycerin (TUCKS) pad Apply 1 application topically 3 (three)  times daily as needed for itching. Apply to external rectum    [provider]  zinc oxide (BALMEX) 11.3 % CREA cream Apply 1 application topically as needed.    [provider]    Family History Family History  Problem Relation Age of Onset  . Diabetes Mother   . Hypertension Father   . Emphysema Father     Social History Social History   Tobacco Use  . Smoking status: Never Smoker  . Smokeless tobacco: Never Used  Substance Use Topics  . Alcohol use: No    Alcohol/week: 0.0 standard drinks  . Drug use: No     Allergies   Bee venom   Review of Systems Review of Systems  Respiratory: Positive for shortness of breath.   All other systems reviewed and are negative.    Physical Exam Updated Vital Signs There  were no vitals taken for this visit.  Physical Exam Vitals signs and nursing note reviewed.  Constitutional:      General: She is not in acute distress.    Appearance: She is well-developed. She is not diaphoretic.  HENT:     Head: Normocephalic and atraumatic.  Neck:     Musculoskeletal: Normal range of motion and neck supple.  Cardiovascular:     Rate and Rhythm: Normal rate and regular rhythm.     Heart sounds: No murmur. No friction rub. No gallop.   Pulmonary:     Effort: Pulmonary effort is normal. No respiratory distress.     Breath sounds: Examination of the right-middle field reveals rhonchi. Examination of the left-middle field reveals rhonchi. Rhonchi present. No wheezing.  Abdominal:     General: Bowel sounds are normal. There is no distension.     Palpations: Abdomen is soft.     Tenderness: There is no abdominal tenderness.  Musculoskeletal: Normal range of motion.     Right lower leg: She exhibits no tenderness. No edema.     Left lower leg: She exhibits no tenderness. No edema.  Skin:    General: Skin is warm and dry.  Neurological:     Mental Status: She is alert and oriented to person, place, and time.      ED  Treatments / Results  Labs (all labs ordered are listed, but only abnormal results are displayed) Labs Reviewed  CBC - Abnormal; Notable for the following components:      Result Value   Hemoglobin 11.3 (*)    MCHC 28.0 (*)    All other components within normal limits  BRAIN NATRIURETIC PEPTIDE  COMPREHENSIVE METABOLIC PANEL  TROPONIN I    EKG    Radiology Dg Chest Port 1 View  Result Date: 03/27/2019 CLINICAL DATA:  Dyspnea EXAM: PORTABLE CHEST 1 VIEW COMPARISON:  07/17/2015 chest radiograph. FINDINGS: Low lung volumes. Stable cardiomediastinal silhouette with normal heart size. No pneumothorax. Probable small bilateral pleural effusions. Patchy bibasilar lung opacities. IMPRESSION: Low lung volumes with patchy bibasilar lung opacities, which could represent pneumonia, aspiration and/or atelectasis. Probable small bilateral pleural effusions. Electronically Signed   By: Delbert Phenix M.D.   On: 03/27/2019 14:20    Procedures Procedures (including critical care time)  Medications Ordered in ED Medications - No data to display   Initial Impression / Assessment and Plan / ED Course  I have reviewed the triage vital signs and the nursing notes.  Pertinent labs & imaging results that were available during my care of the patient were reviewed by me and considered in my medical decision making (see chart for details).  Patient sent from her extended care facility for second anaerobe for worsening hypoxia.  She was diagnosed earlier this week with COVID-19.  She was seen yesterday at [facility], then discharged.  Patient's oxygen requirement today stands at 5 L nasal cannula to maintain saturations in the upper 90s.  Patient's blood pressure while in the ED has been somewhat soft, however these readings were checked primarily while the patient was asleep.  She will be given a normal saline bolus.  Care discussed with Dr. Julian Reil and patient will likely be admitted to the [facility]  campus.  Final Clinical Impressions(s) / ED Diagnoses   Final diagnoses:  None    ED Discharge Orders    None       Geoffery Lyons, MD 03/28/19 [phone]

## 2019-03-28 NOTE — Consult Note (Signed)
[facility] Nurse wound consult note Consultation was completed by review of records and assistance from the bedside nurse/clinical staff.    Reason for Consult: buttocks, perineal area, skin folds Spoke with nurse providing care as well as collaboration with second nurse present for admission skin assessment  Wound type: MASD (moisture associated skin damage) related to urinary and bowl incontinence Fungal overgrowth as evidenced by intense redness with satellite lesions  Patient from SNF with brief used for incontinence  Bedside nurse reports intense redness over the bilateral buttocks consistent with incontinence related dermatitis  Erythema noted in skin folds under pannus Pressure Injury POA:NA Measurement:NA Wound bed:see above  Drainage (amount, consistency, odor) NA Periwound: intact  Dressing procedure/placement/frequency: Add Gerhardt's butt cream for moisture barrier Add [place] for urinary incontinence Request PO Diflucan for candida overgrowth Antimicrobial wicking fabric in skin folds for intertriginous dermatis  Dermatherapy for moisture management and antimicrobial effects.  Discussed POC with bedside nurse.  Re consult if needed, will not follow at this time. Thanks  Millee Denise [employer], RN,CWOCN, CNS, [place] [phone])

## 2019-03-28 NOTE — Progress Notes (Signed)
Pharmacy Brief Note  O:  ALT: 8 CXR: possible PNA SpO2: 98% on 4L [place]  A/P:  Patient meets requirements for remdesivir. Will initiate remdesivir 200 mg once followed by 100 mg daily x 4 days.   Ulice Dash, PharmD, BCPS Clinical Pharmacist

## 2019-03-28 NOTE — ED Notes (Signed)
Weaned pt to 5L [place]. Pt sating 100%

## 2019-03-28 NOTE — ED Notes (Signed)
MD informed on elevated trop

## 2019-03-28 NOTE — ED Notes (Signed)
Called [facility] °

## 2019-03-28 NOTE — ED Notes (Signed)
SNF called and sts "pt O2 was not working in room and pt was not receiving the o2 she needed. The oxygen is fixed now." Dr. Lorin Mercy made aware.

## 2019-03-28 NOTE — ED Triage Notes (Addendum)
Pt coming from [facility] after develpoing shob tonight. Pt seen earlier yesterday at [facility] for same. COVID+. Pt received several neb tx from facility before coming in, did not help. Pt 56% on RA. Pt placed on nonrebreather

## 2019-03-28 NOTE — Plan of Care (Signed)
Progressing

## 2019-03-28 NOTE — H&P (Signed)
History and Physical    Meghan Patterson ATF:[id number] DOB: 01-Oct-1961 DOA: 03/28/2019  PCP: Denny Levy, DO  Patient coming from: [facility]; NOK: Rolm Gala, [phone]  Chief Complaint: SOB  HPI: Meghan Patterson is a 58 y.o. female with medical history significant of RA; intellectual disability; hypothyroidism; HTN; HLD; CHF; COPD; and DM presenting with worsening SOB associated with COVID infection.  She was 56% on RA.  She was seen yesterday at [facility] for the same.  She was able to be weaned to 5L [place] O2 in the ER.  I spoke with her sister.  She was notified yesterday that she was being taken to [facility] because of her breathing, tested positive for COVID.  [facility] did not notify her sister of anything.  She called last night and was told that she was released back to the SNF.  She appeared to be feeling better, "it was her COPD that acted up."  She has not had these issues before.  She had another spell overnight and she wanted to go to the hospital.  Her sister is her POA and will need to be contacted prior to any decisions.  She reports that her sister "could not handle" life support and she requests that her sister be made DNR. However, she would want to be informed if her condition is worsening.    ED Course:  Carryover, per Dr. Julian Reil:  58 yo F with worsening hypoxia from COVID, was satting 56% on RA. Got put on 5L here, now on 10L. BP also trending down hill into the 90s. Suspect she will be intubated before AM admitting team can see her (as I told the EDP) given her current trend.  Review of Systems: Unable to perform    Past Medical History:  Diagnosis Date   Depressive disorder    Diabetes mellitus without complication (HCC)    FTT (failure to thrive) in adult    GERD (gastroesophageal reflux disease)    Heart failure (HCC)    Hypercholesteremia    Hypertension    Hypothyroid    Hypoxemia    Intellectual disability    Magnesium deficiency     Rheumatoid arthritis (HCC)     Past Surgical History:  Procedure Laterality Date   ABDOMINAL HYSTERECTOMY      Social History   Socioeconomic History   Marital status: Single    Spouse name: Not on file   Number of children: Not on file   Years of education: Not on file   Highest education level: Not on file  Occupational History   Not on file  Social Needs   Financial resource strain: Not on file   Food insecurity    Worry: Not on file    Inability: Not on file   Transportation needs    Medical: Not on file    Non-medical: Not on file  Tobacco Use   Smoking status: Never Smoker   Smokeless tobacco: Never Used  Substance and Sexual Activity   Alcohol use: No    Alcohol/week: 0.0 standard drinks   Drug use: No   Sexual activity: Not on file  Lifestyle   Physical activity    Days per week: Not on file    Minutes per session: Not on file   Stress: Not on file  Relationships   Social connections    Talks on phone: Not on file    Gets together: Not on file    Attends religious service: Not on  file    Active member of club or organization: Not on file    Attends meetings of clubs or organizations: Not on file    Relationship status: Not on file   Intimate partner violence    Fear of current or ex partner: Not on file    Emotionally abused: Not on file    Physically abused: Not on file    Forced sexual activity: Not on file  Other Topics Concern   Not on file  Social History Narrative   Not on file    Allergies  Allergen Reactions   Bee Venom Other (See Comments)    On MAR    Family History  Problem Relation Age of Onset   Diabetes Mother    Hypertension Father    Emphysema Father     Prior to Admission medications   Medication Sig Start Date End Date Taking? Authorizing Provider  acetaminophen (TYLENOL) 325 MG tablet Take 650 mg by mouth every 8 (eight) hours as needed for fever or headache (or muscle pain).    Yes [provider]  albuterol (VENTOLIN HFA) 108 (90 Base) MCG/ACT inhaler Inhale 2 puffs into the lungs every 4 (four) hours as needed for wheezing or shortness of breath.   Yes [provider]  atorvastatin (LIPITOR) 80 MG tablet Take 80 mg by mouth at bedtime.   Yes [provider]  citalopram (CELEXA) 10 MG tablet Take 10 mg by mouth daily.   Yes [provider]  cycloSPORINE (RESTASIS) 0.05 % ophthalmic emulsion Place 1 drop into both eyes 2 (two) times daily.   Yes [provider]  dicyclomine (BENTYL) 20 MG tablet Take 20 mg by mouth 3 (three) times daily before meals.   Yes [provider]  fentaNYL (DURAGESIC - DOSED MCG/HR) 12 MCG/HR Apply one patch every 72 hours for pain. Remove old patch. External Use. Rotate sites Patient taking differently: Place 1 patch onto the skin every 3 (three) days.  02/17/16  Yes Montez Morita, Monica, DO  Fluticasone-Salmeterol (ADVAIR) 250-50 MCG/DOSE AEPB Inhale 1 puff into the lungs 2 (two) times daily.   Yes [provider]  furosemide (LASIX) 20 MG tablet Take 1 tablet (20 mg total) by mouth daily. 07/21/15  Yes Katha Hamming, MD  ipratropium-albuterol (DUONEB) 0.5-2.5 (3) MG/3ML SOLN Take 3 mLs by nebulization every 6 (six) hours as needed (for wheezing).    Yes [provider]  levothyroxine (SYNTHROID, LEVOTHROID) 150 MCG tablet Take 150 mcg by mouth daily before breakfast.    Yes [provider]  losartan (COZAAR) 50 MG tablet Take 50 mg by mouth daily.   Yes [provider]  Melatonin 5 MG TABS Take 5 mg by mouth at bedtime.   Yes [provider]  Menthol, Topical Analgesic, (BIOFREEZE) 4 % GEL Apply 1 application topically 3 (three) times daily. To right shoulder for pain   Yes [provider]  metFORMIN (GLUCOPHAGE-XR) 500 MG 24 hr tablet Take 500 mg by mouth daily with breakfast.   Yes [provider]  metoprolol succinate (TOPROL-XL) 25 MG 24 hr  tablet Take 25 mg by mouth daily.   Yes [provider]  pantoprazole (PROTONIX) 40 MG tablet Take 40 mg by mouth every other day.   Yes [provider]  polyethylene glycol (MIRALAX / GLYCOLAX) packet Take 17 g by mouth daily as needed for mild constipation.    Yes [provider]  potassium chloride SA (K-DUR) 20 MEQ tablet Take  20 mEq by mouth daily.   Yes [provider]  saccharomyces boulardii (FLORASTOR) 250 MG capsule Take 250 mg by mouth at bedtime.   Yes [provider]  sodium chloride (OCEAN) 0.65 % nasal spray Place 1 spray into the nose 3 (three) times daily.   Yes [provider]  traZODone (DESYREL) 50 MG tablet Take 50 mg by mouth at bedtime.   Yes [provider]  vitamin B-12 (CYANOCOBALAMIN) 1000 MCG tablet Take 500 mcg by mouth daily.    Yes [provider]  Vitamin D, Ergocalciferol, (DRISDOL) 1.25 MG (50000 UT) CAPS capsule Take 50,000 Units by mouth every 30 (thirty) days.   Yes [provider]  zinc oxide (BALMEX) 11.3 % CREA cream Apply 1 application topically as needed (to buttocks).    Yes [provider]  metoprolol tartrate (LOPRESSOR) 25 MG tablet Take 1 tablet (25 mg total) by mouth 2 (two) times daily. Patient not taking: Reported on 03/28/2019 07/21/15   Katha HammingKonidena, Snehalatha, MD  OXYGEN Inhale 2 L into the lungs continuous.    [provider]  predniSONE (DELTASONE) 20 MG tablet 2 tabs po daily x 4 days Patient not taking: Reported on 03/28/2019 03/27/19   Melene PlanFloyd, Dan, DO    Physical Exam: Vitals:   03/28/19 0608 03/28/19 0630 03/28/19 0730 03/28/19 0915  BP:  103/71 125/63 (!) 102/48  Pulse: (!) 106 (!) 103 100 92  Resp: 15 14 14 13   SpO2: 100% 100% 100% 100%      General:  Appears calm and comfortable and is NAD  Eyes:   EOMI, normal lids, iris  ENT:  grossly normal hearing, lips & tongue, mmm  Neck:  no LAD, masses or thyromegaly  Cardiovascular:  RR  with mild tachycardia, no m/r/g. No LE edema.   Respiratory:   CTA bilaterally with no wheezes/rales/rhonchi.  Normal respiratory effort.  Abdomen:  soft, NT, ND, NABS  Skin:  no rash or induration seen on limited exam  Musculoskeletal:  grossly normal tone BUE/BLE, good ROM, no bony abnormality  Psychiatric: blunted mood and affect, somnolent, minimally conversant  Neurologic:  Unable to perform    Radiological Exams on Admission: Dg Chest Port 1 View  Result Date: 03/28/2019 CLINICAL DATA:  58 year old patient with fever cough and shortness of breath. EXAM: PORTABLE CHEST ONE VIEW COMPARISON:  03/27/2019. FINDINGS: Semi upright AP portable view at 0207 hours. Stable lung volumes and mediastinal contours. Stable patchy opacity at the lung bases with no pneumothorax, pulmonary edema or definite effusion. Negative tracheal air column. Nonobstructed visible bowel gas pattern. Stable visible osseous structures. IMPRESSION: Unchanged portable appearance of the chest since yesterday. Electronically Signed   By: Odessa FlemingH  Hall M.D.   On: 03/28/2019 03:51   Dg Chest Port 1 View  Result Date: 03/27/2019 CLINICAL DATA:  Dyspnea EXAM: PORTABLE CHEST 1 VIEW COMPARISON:  07/17/2015 chest radiograph. FINDINGS: Low lung volumes. Stable cardiomediastinal silhouette with normal heart size. No pneumothorax. Probable small bilateral pleural effusions. Patchy bibasilar lung opacities. IMPRESSION: Low lung volumes with patchy bibasilar lung opacities, which could represent pneumonia, aspiration and/or atelectasis. Probable small bilateral pleural effusions. Electronically Signed   By: Delbert PhenixJason A Poff M.D.   On: 03/27/2019 14:20    EKG: Independently reviewed.  Sinus tachycardia with rate 119; RBBB; probable LVH with no evidence of acute ischemia   Labs on Admission: I have personally reviewed the available labs and imaging studies at the time of the admission.  Pertinent labs:  CO2 38 Glucose 160 Albumin  3.0 BNP 504.7 Troponin 0.19 WBC 10.1 Hgb 11.3 Reportedly COVID positive from facility - will not retest at this time, as per discussion with Dr. Ninetta LightsHatcher   Assessment/Plan Principal Problem:   Acute respiratory disease due to COVID-19 virus Active Problems:   HTN (hypertension)   Hypothyroid   Type 2 diabetes mellitus with peripheral vascular disease (HCC)   HLD (hyperlipidemia)   Chronic systolic heart failure (HCC)   Moderate intellectual disabilities   Chronic respiratory failure with hypoxia (HCC)   Acute on chronic respiratory failure with hypoxia due to COVID in the setting of COPD -[facility] -[facility] to worsening respiratory failure (wears 2L home O2 for COPD at baseline), but [facility] and so [facility] -She again became SOB and hypoxic and was 56% on RA upon arrival; there was concern that the patient would need to be intubated -However, after resumption of [place] O2, she improved; this was able to be weaned to 4L -CXR with multifocal opacities which may be c/w COVID  -In further discussion, [facility] apparently realized that the oxygen in the patient's room was malfunctioning and so this significantly contributed to her presenting SOB and hypoxia -However, given that she is COVID+ with an abnormal CXR and hypoxia, she should be treated with steroids and Remdesivir in accordance with current guidelines -The patient has comorbidities which may increase the risk for ARDS/MODS including:  HTN, DM, obesity, COPD -Patient has an elevated troponin which is thought to be related to demand ischemia in the setting of respiratory failure due to COVID infection rather than ACS; will monitor on telemetry and trend -Will treat with broad-spectrum antibiotics if procalcitonin >0.1.   -[facility] evaluation, close monitoring, and treatment -Monitor on telemetry x at  least 24 hours -At this time, will attempt to avoid use of aerosolized medications and use HFAs instead -Will check daily labs including BMP with Mag, Phos; LFTs; CBC with differential; CRP (q12h); ferritin; fibrinogen; D-dimer (q12h) -Consider Echo (particularly if troponin continues to rise)  -Will order steroids (1 mg/kg divided BID) and Remdesivir (pharmacy consult) given +COVID test, +CXR, and hypoxia <94% on room air -If the patient shows clinical deterioration, consider transfer to ICU with PCCM consultation -Since the patient is hypoxic or on >3L oxygen from baseline or CRP >7, considerTocilizumab 8 mg/kg x1 IF + COVID test; O2 sats <88% on room air; and patient is at high risk for intubation.  [facility] this medication since it is being used off label. -Will attempt to maintain euvolemia to a net negative fluid status -Will ask the patient to maintain an awake prone position for 16+ hours a day, if possible, with a minimum of 2-3 hours at a time -If D-dimer <5, will use standard-dosed Lovenox for DVT prevention -Patient was seen wearing full PPE including: gown, gloves, head cover, N95, and face shield; donning and doffing was in compliance with current standards.  Chronic diastolic CHF -BNP significantly elevated from 2016 -Echo in 2016 with preserved EF and grade 1 diastolic dysfunction -Given Lasix in the ER -Will hold IVF and continue daily PO Lasix for now, although she is not clearly volume overloaded on exam -Continue Toprol XL, Cozaar  HTN -Continue Toprol -Continue Cozaar (start tomorrow given borderline BP on admission)  HLD -Continue Lipitor  DM -Will check A1c -  was 6.3 in 12/17 -hold Glucophage -Cover with moderate-scale SSI  Intellectual disability -Patient reported to have the decision-making capacity of a 58yo - unable to make her own medical decisions -Please communicate with her sister, who is her POA -Her sister reports that she  would not desire CPR or heroic measures, but if her condition were to worsen then her sister would desire to be informed along the way so that she could involve her own children in that process  Hypothyroidism -Check TSH -Continue Synthroid at current dose for now  Chronic pain -Continue Duragesic patch  DVT prophylaxis:  Lovenox  Code Status:  DNR - confirmed with patient Family Communication: None present; I spoke with the patient's sister by telephone. Disposition Plan:  Home once clinically improved Consults called: None  Admission status: Admit - It is my clinical opinion that admission to INPATIENT is reasonable and necessary because of the expectation that this patient will require hospital care that crosses at least 2 midnights to treat this condition based on the medical complexity of the problems presented.  Given the aforementioned information, the predictability of an adverse outcome is felt to be significant.      Karmen Bongo MD [facility]   How to contact the [facility] Attending or Consulting provider [place] or covering provider during after hours [place], for this patient?  1. Check the care team in [facility] and look for a) attending/consulting [facility] provider listed and b) the [facility] team listed 2. Log into [email or web address] and use [place] universal password to access. If you do not have the password, please contact the hospital operator. 3. Locate the [facility] provider you are looking for under [facility] and page to a number that you can be directly reached. 4. If you still have difficulty reaching the provider, please page the [facility] (Director on Call) for the Hospitalists listed on amion for assistance.   03/28/2019, 9:28 AM

## 2019-03-28 NOTE — ED Notes (Signed)
Updated NP for facility that pt will be admitted

## 2019-03-28 NOTE — ED Notes (Signed)
Meghan Patterson, sister updated that patient is leaving to [place]

## 2019-03-29 LAB — CBC WITH DIFFERENTIAL/PLATELET
Abs Immature Granulocytes: 0.19 10*3/uL — ABNORMAL HIGH (ref 0.00–0.07)
Basophils Absolute: 0 10*3/uL (ref 0.0–0.1)
Basophils Relative: 1 %
Eosinophils Absolute: 0 10*3/uL (ref 0.0–0.5)
Eosinophils Relative: 0 %
HCT: 39.4 % (ref 36.0–46.0)
Hemoglobin: 10.9 g/dL — ABNORMAL LOW (ref 12.0–15.0)
Immature Granulocytes: 5 %
Lymphocytes Relative: 13 %
Lymphs Abs: 0.5 10*3/uL — ABNORMAL LOW (ref 0.7–4.0)
MCH: 26.5 pg (ref 26.0–34.0)
MCHC: 27.7 g/dL — ABNORMAL LOW (ref 30.0–36.0)
MCV: 95.6 fL (ref 80.0–100.0)
Monocytes Absolute: 0.1 10*3/uL (ref 0.1–1.0)
Monocytes Relative: 2 %
Neutro Abs: 3.3 10*3/uL (ref 1.7–7.7)
Neutrophils Relative %: 79 %
Platelets: 266 10*3/uL (ref 150–400)
RBC: 4.12 MIL/uL (ref 3.87–5.11)
RDW: 14.1 % (ref 11.5–15.5)
WBC: 4.1 10*3/uL (ref 4.0–10.5)
nRBC: 0.5 % — ABNORMAL HIGH (ref 0.0–0.2)

## 2019-03-29 LAB — GLUCOSE, CAPILLARY
Glucose-Capillary: 168 mg/dL — ABNORMAL HIGH (ref 70–99)
Glucose-Capillary: 253 mg/dL — ABNORMAL HIGH (ref 70–99)
Glucose-Capillary: 176 mg/dL — ABNORMAL HIGH (ref 70–99)
Glucose-Capillary: 256 mg/dL — ABNORMAL HIGH (ref 70–99)

## 2019-03-29 LAB — COMPREHENSIVE METABOLIC PANEL
ALT: 12 U/L (ref 0–44)
AST: 19 U/L (ref 15–41)
Albumin: 2.9 g/dL — ABNORMAL LOW (ref 3.5–5.0)
Alkaline Phosphatase: 58 U/L (ref 38–126)
Anion gap: 11 (ref 5–15)
BUN: 23 mg/dL — ABNORMAL HIGH (ref 6–20)
CO2: 45 mmol/L — ABNORMAL HIGH (ref 22–32)
Calcium: 8.4 mg/dL — ABNORMAL LOW (ref 8.9–10.3)
Chloride: 87 mmol/L — ABNORMAL LOW (ref 98–111)
Creatinine, Ser: 0.43 mg/dL — ABNORMAL LOW (ref 0.44–1.00)
GFR calc Af Amer: >60 mL/min (ref >60)
GFR calc non Af Amer: >60 mL/min (ref >60)
Glucose, Bld: 204 mg/dL — ABNORMAL HIGH (ref 70–99)
Potassium: 4.7 mmol/L (ref 3.5–5.1)
Sodium: 143 mmol/L (ref 135–145)
Total Bilirubin: 0.3 mg/dL (ref 0.3–1.2)
Total Protein: 6.5 g/dL (ref 6.5–8.1)

## 2019-03-29 LAB — HIV ANTIBODY (ROUTINE TESTING W REFLEX): HIV Screen 4th Generation wRfx: NONREACTIVE

## 2019-03-29 LAB — D-DIMER, QUANTITATIVE
D-Dimer, Quant: 1.32 ug/mL-FEU — ABNORMAL HIGH (ref 0.00–0.50)
D-Dimer, Quant: 1.23 ug/mL-FEU — ABNORMAL HIGH (ref 0.00–0.50)

## 2019-03-29 LAB — FERRITIN: Ferritin: 101 ng/mL (ref 11–307)

## 2019-03-29 LAB — MAGNESIUM: Magnesium: 1.5 mg/dL — ABNORMAL LOW (ref 1.7–2.4)

## 2019-03-29 LAB — C-REACTIVE PROTEIN: CRP: 2.8 mg/dL — ABNORMAL HIGH (ref <1.0)

## 2019-03-29 MED ORDER — METHYLPREDNISOLONE SODIUM SUCC 40 MG IJ SOLR
40.0000 mg | Freq: Two times a day (BID) | INTRAMUSCULAR | Status: DC
  Administered 2019-03-29 – 2019-03-30 (×4): 40 mg via INTRAVENOUS
  Filled 2019-03-29 (×4): qty 1

## 2019-03-29 MED ORDER — ENOXAPARIN SODIUM 60 MG/0.6ML ~~LOC~~ SOLN
50.0000 mg | Freq: Two times a day (BID) | SUBCUTANEOUS | Status: DC
  Administered 2019-03-29 – 2019-04-02 (×8): 50 mg via SUBCUTANEOUS
  Filled 2019-03-29 (×12): qty 0.5

## 2019-03-29 MED ORDER — ASPIRIN 81 MG PO CHEW
81.0000 mg | CHEWABLE_TABLET | Freq: Every day | ORAL | Status: DC
  Administered 2019-03-29 – 2019-04-02 (×5): 81 mg via ORAL
  Filled 2019-03-29 (×5): qty 1

## 2019-03-29 MED ORDER — ENOXAPARIN SODIUM 60 MG/0.6ML ~~LOC~~ SOLN
50.0000 mg | SUBCUTANEOUS | Status: DC
  Filled 2019-03-29: qty 0.5

## 2019-03-29 MED ORDER — INSULIN GLARGINE 100 UNIT/ML ~~LOC~~ SOLN
15.0000 [IU] | Freq: Every day | SUBCUTANEOUS | Status: DC
  Administered 2019-03-29 – 2019-04-02 (×5): 15 [IU] via SUBCUTANEOUS
  Filled 2019-03-29 (×5): qty 0.15

## 2019-03-29 MED ORDER — MAGNESIUM SULFATE 2 GM/50ML IV SOLN
2.0000 g | Freq: Once | INTRAVENOUS | Status: AC
  Administered 2019-03-29: 2 g via INTRAVENOUS
  Filled 2019-03-29: qty 50

## 2019-03-29 NOTE — Progress Notes (Addendum)
Ok to change lovenox to 0.5mg /kg/day due to BMI. Ok per Dr. Candiss Norse  Lovenox 50mg  SQ q24  Onnie Boer, PharmD, [place], AAHIVP, CPP Infectious Disease Pharmacist 03/29/2019 12:54 PM

## 2019-03-29 NOTE — Progress Notes (Signed)
PROGRESS NOTE                                                                                                                                                                                                             Patient Demographics:    Meghan Patterson, is a 58 y.o. female, DOB - [birth year], WJX:[id number]  Outpatient Primary MD for the patient is Fischer, Karn Pickler, DO    LOS - 1  Admit date - 03/28/2019    Chief Complaint  Patient presents with  . Shortness of Breath       Brief Narrative  Meghan Patterson is a 58 y.o. female with medical history significant of RA; intellectual disability; hypothyroidism; HTN; HLD; CHF; COPD; and DM presenting with worsening SOB associated with COVID infection, she was found to be profoundly hypoxic requiring oxygen in the ER and admitted for further care for COVID-19 pneumonitis related acute hypoxic respiratory failure.   Subjective:    Meghan Patterson today has, No headache, No chest pain, No abdominal pain - No Nausea, No new weakness tingling or numbness, Improved Cough - SOB.     Assessment  & Plan :     1. Acute Hypoxic Resp. Failure due to Acute Covid 19 Viral Illness during the ongoing 2020 Covid 19 Pandemic - she was profoundly hypoxic upon admission, inflammatory markers were borderline elevated, she was promptly started on IV steroids along with REMDESVIR.  Her shortness of breath has improved and she is feeling better.  Continue present line of care, advance activity, encouraged to sit up in the daytime use flutter valve and I-S for pulmonary toiletry.  Prone in bed at night.  COVID-19 Labs  Recent Labs    03/28/19 1107 03/28/19 1645 03/29/19 0400  DDIMER 2.24* 2.11* 1.32*  FERRITIN 213  --  101  LDH 177  --   --   CRP 3.7*  --  2.8*    No results found for: SARSCOV2NAA   Hepatic Function Latest Ref Rng & Units 03/29/2019 03/28/2019 04/15/2016  Total Protein  6.5 - 8.1 g/dL 6.5 6.4(L) -  Albumin 3.5 - 5.0 g/dL 2.9(L) 3.0(L) -  AST 15 - 41 U/L _0 ALT 0 - 44 U/L _1 Alk Phosphatase 38 - 126 U/L 58 53 44  Total Bilirubin 0.3 -  1.2 mg/dL 0.3 0.8 -        Component Value Date/Time   BNP 504.7 (H) 03/28/2019 0214      2.  Underlying COPD.  Minimal to no wheezing.  She is on 2 L nasal cannula at SNF.  Continue supportive care.  3.  Chronic diastolic CHF with EF 16% in October 2016.  She is compensated continue home dose beta-blocker and diuretic.  4.  Mildly elevated flat trend troponin upon admission.  Due to demand mismatch caused by profound hypoxia, no chest pain, EKG unchanged, trend was flat not an ACS pattern, continue combination of statin and beta-blocker, low-dose aspirin added for secondary prevention.  5. Hypothyroidism.  Home dose Synthroid.  Stable TSH.  6.  Chronic pain.  On Duragesic patch continue.  7.  Dyslipidemia.  On statin.  8.  Essential hypertension.  On combination of beta-blocker and ARB.  9.  Intellectual disability.  SNF upon discharge.  Sister updated.  10.  DM type II.  In reasonable control.  Continue moderate SSI along with low-dose Lantus as she is on steroids for now.  Lab Results  Component Value Date   HGBA1C 5.9 (H) 03/28/2019   CBG (last 3)  Recent Labs    03/28/19 1640 03/28/19 2110 03/29/19 0820  GLUCAP 156* 330* 168*       Condition - Extremely Guarded  Family Communication  :  Sister on 03/29/19  Code Status :  DNR  Diet :   Diet Order            Diet Carb Modified Fluid consistency: Thin; Room service appropriate? Yes  Diet effective now               Disposition Plan  :  SNF  Consults  :  None  Procedures  :  None  PUD Prophylaxis :  PPI  DVT Prophylaxis  :  Lovenox   Lab Results  Component Value Date   PLT 266 03/29/2019    Inpatient Medications  Scheduled Meds: . acidophilus  1 capsule Oral Daily  . atorvastatin  80 mg Oral QHS  .  chlorhexidine  15 mL Mouth Rinse BID  . citalopram  10 mg Oral Daily  . cycloSPORINE  1 drop Both Eyes BID  . dicyclomine  20 mg Oral TID AC  . docusate sodium  100 mg Oral BID  . enoxaparin (LOVENOX) injection  40 mg Subcutaneous Q24H  . fentaNYL  1 patch Transdermal Q72H  . fluconazole  100 mg Oral Daily  . furosemide  20 mg Oral Daily  . Gerhardt's butt cream   Topical TID  . insulin aspart  0-15 Units Subcutaneous TID WC  . insulin aspart  0-5 Units Subcutaneous QHS  . levothyroxine  150 mcg Oral QAC breakfast  . losartan  50 mg Oral Daily  . mouth rinse  15 mL Mouth Rinse q12n4p  . Melatonin  5 mg Oral QHS  . methylPREDNISolone (SOLU-MEDROL) injection  40 mg Intravenous Q12H  . metoprolol succinate  25 mg Oral Daily  . mometasone-formoterol  2 puff Inhalation BID  . pantoprazole  40 mg Oral QODAY  . traZODone  50 mg Oral QHS   Continuous Infusions: . remdesivir 100 mg in NS 250 mL     PRN Meds:.acetaminophen, albuterol, bisacodyl, [DISCONTINUED] ondansetron **OR** ondansetron (ZOFRAN) IV, oxyCODONE, polyethylene glycol  Antibiotics  :    Anti-infectives (From admission, onward)   Start     Dose/Rate Route Frequency Ordered  Stop   03/29/19 1530  remdesivir 100 mg in sodium chloride 0.9 % 230 mL IVPB     100 mg 500 mL/hr over 30 Minutes Intravenous Every 24 hours 03/28/19 1411 04/02/19 1529   03/28/19 1800  fluconazole (DIFLUCAN) tablet 100 mg     100 mg Oral Daily 03/28/19 1720     03/28/19 1530  remdesivir 200 mg in sodium chloride 0.9 % 210 mL IVPB     200 mg 500 mL/hr over 30 Minutes Intravenous Once 03/28/19 1411 03/29/19 0846       Time Spent in minutes  30   Lala Lund M.D on 03/29/2019 at 10:31 AM  To page go to [email or web address] - password [email or web address]  [facility] -  Office  [phone]    See all Orders from today for further details    Objective:   Vitals:   03/28/19 1800 03/28/19 2000 03/29/19 0515 03/29/19 0823  BP:  103/66 138/71 132/75   Pulse:  (!) 104  92  Resp:  (!) 21    Temp:  98.8 F (37.1 C) 98.7 F (37.1 C) 99 F (37.2 C)  TempSrc:  Oral Oral Oral  SpO2:  97%  95%  Weight: 93.3 kg     Height: _0  (1.575 m)       Wt Readings from Last 3 Encounters:  03/28/19 93.3 kg  02/28/17 124.3 kg  01/20/17 124.5 kg     Intake/Output Summary (Last 24 hours) at 03/29/2019 1031 Last data filed at 03/29/2019 0913 Gross per 24 hour  Intake 970 ml  Output 200 ml  Net 770 ml     Physical Exam  Awake Alert, No new F.N deficits,   Fowler.AT,PERRAL Supple Neck,No JVD, No cervical lymphadenopathy appriciated.  Symmetrical Chest wall movement, Good air movement bilaterally, CTAB RRR,No Gallops,Rubs or new Murmurs, No Parasternal Heave +ve B.Sounds, Abd Soft, No tenderness, No organomegaly appriciated, No rebound - guarding or rigidity. No Cyanosis, Clubbing or edema, No new Rash or bruise       Data Review:    CBC Recent Labs  Lab 03/27/19 1550 03/28/19 0214 03/29/19 0400  WBC 8.5 10.1 4.1  HGB 11.7* 11.3* 10.9*  HCT 42.8 40.3 39.4  PLT 231 246 266  MCV 97.1 95.5 95.6  MCH 26.5 26.8 26.5  MCHC 27.3* 28.0* 27.7*  RDW 13.6 13.9 14.1  LYMPHSABS 0.9  --  0.5*  MONOABS 0.6  --  0.1  EOSABS 0.0  --  0.0  BASOSABS 0.0  --  0.0    Chemistries  Recent Labs  Lab 03/27/19 1550 03/28/19 0214 03/29/19 0400  NA 142 142 143  K 4.3 4.2 4.7  CL 90* 89* 87*  CO2 39* 38* 45*  GLUCOSE 109* 160* 204*  BUN 18 18 23*  CREATININE 0.51 0.68 0.43*  CALCIUM 8.6* 8.4* 8.4*  MG  --   --  1.5*  AST  --  27 19  ALT  --  8 12  ALKPHOS  --  53 58  BILITOT  --  0.8 0.3   ------------------------------------------------------------------------------------------------------------------ No results for input(s): CHOL, HDL, LDLCALC, TRIG, CHOLHDL, LDLDIRECT in the last 72 hours.  Lab Results  Component Value Date   HGBA1C 5.9 (H) 03/28/2019    ------------------------------------------------------------------------------------------------------------------ Recent Labs    03/28/19 1107  TSH 0.609    Cardiac Enzymes Recent Labs  Lab 03/28/19 0214 03/28/19 0717  TROPONINI 0.19* 0.20*   ------------------------------------------------------------------------------------------------------------------    Component Value Date/Time   BNP 504.7 (  H) 03/28/2019 0214    Micro Results Recent Results (from the past 240 hour(s))  MRSA PCR Screening     Status: None   Collection Time: 03/28/19  4:20 PM   Specimen: Nasal Mucosa; Nasopharyngeal  Result Value Ref Range Status   MRSA by PCR NEGATIVE NEGATIVE Final    Comment:        The GeneXpert MRSA Assay (FDA approved for NASAL specimens only), is one component of a comprehensive MRSA colonization surveillance program. It is not intended to diagnose MRSA infection nor to guide or monitor treatment for MRSA infections. Performed at [facility], [place] [address] [place], [place] [address]     Radiology Reports Dg Chest Port 1 View  Result Date: 03/28/2019 CLINICAL DATA:  59 year old patient with fever cough and shortness of breath. EXAM: PORTABLE CHEST ONE VIEW COMPARISON:  03/27/2019. FINDINGS: Semi upright AP portable view at 0207 hours. Stable lung volumes and mediastinal contours. Stable patchy opacity at the lung bases with no pneumothorax, pulmonary edema or definite effusion. Negative tracheal air column. Nonobstructed visible bowel gas pattern. Stable visible osseous structures. IMPRESSION: Unchanged portable appearance of the chest since yesterday. Electronically Signed   By: Genevie Ann M.D.   On: 03/28/2019 03:51   Dg Chest Port 1 View  Result Date: 03/27/2019 CLINICAL DATA:  Dyspnea EXAM: PORTABLE CHEST 1 VIEW COMPARISON:  07/17/2015 chest radiograph. FINDINGS: Low lung volumes. Stable cardiomediastinal silhouette with normal heart size. No  pneumothorax. Probable small bilateral pleural effusions. Patchy bibasilar lung opacities. IMPRESSION: Low lung volumes with patchy bibasilar lung opacities, which could represent pneumonia, aspiration and/or atelectasis. Probable small bilateral pleural effusions. Electronically Signed   By: Ilona Sorrel M.D.   On: 03/27/2019 14:20

## 2019-03-30 ENCOUNTER — Other Ambulatory Visit: Payer: Self-pay

## 2019-03-30 LAB — COMPREHENSIVE METABOLIC PANEL
ALT: 10 U/L (ref 0–44)
AST: 16 U/L (ref 15–41)
Albumin: 2.7 g/dL — ABNORMAL LOW (ref 3.5–5.0)
Alkaline Phosphatase: 63 U/L (ref 38–126)
Anion gap: 13 (ref 5–15)
BUN: 31 mg/dL — ABNORMAL HIGH (ref 6–20)
CO2: 42 mmol/L — ABNORMAL HIGH (ref 22–32)
Calcium: 8.4 mg/dL — ABNORMAL LOW (ref 8.9–10.3)
Chloride: 87 mmol/L — ABNORMAL LOW (ref 98–111)
Creatinine, Ser: 0.51 mg/dL (ref 0.44–1.00)
GFR calc Af Amer: >60 mL/min (ref >60)
GFR calc non Af Amer: >60 mL/min (ref >60)
Glucose, Bld: 185 mg/dL — ABNORMAL HIGH (ref 70–99)
Potassium: 5.5 mmol/L — ABNORMAL HIGH (ref 3.5–5.1)
Sodium: 142 mmol/L (ref 135–145)
Total Bilirubin: 0.2 mg/dL — ABNORMAL LOW (ref 0.3–1.2)
Total Protein: 6 g/dL — ABNORMAL LOW (ref 6.5–8.1)

## 2019-03-30 LAB — CBC WITH DIFFERENTIAL/PLATELET
Abs Immature Granulocytes: 0.09 10*3/uL — ABNORMAL HIGH (ref 0.00–0.07)
Basophils Absolute: 0 10*3/uL (ref 0.0–0.1)
Basophils Relative: 0 %
Eosinophils Absolute: 0 10*3/uL (ref 0.0–0.5)
Eosinophils Relative: 0 %
HCT: 37 % (ref 36.0–46.0)
Hemoglobin: 10.2 g/dL — ABNORMAL LOW (ref 12.0–15.0)
Immature Granulocytes: 2 %
Lymphocytes Relative: 14 %
Lymphs Abs: 0.6 10*3/uL — ABNORMAL LOW (ref 0.7–4.0)
MCH: 26.2 pg (ref 26.0–34.0)
MCHC: 27.6 g/dL — ABNORMAL LOW (ref 30.0–36.0)
MCV: 95.1 fL (ref 80.0–100.0)
Monocytes Absolute: 0.2 10*3/uL (ref 0.1–1.0)
Monocytes Relative: 5 %
Neutro Abs: 3.6 10*3/uL (ref 1.7–7.7)
Neutrophils Relative %: 79 %
Platelets: 242 10*3/uL (ref 150–400)
RBC: 3.89 MIL/uL (ref 3.87–5.11)
RDW: 14 % (ref 11.5–15.5)
WBC: 4.6 10*3/uL (ref 4.0–10.5)
nRBC: 0 % (ref 0.0–0.2)

## 2019-03-30 LAB — GLUCOSE, CAPILLARY
Glucose-Capillary: 189 mg/dL — ABNORMAL HIGH (ref 70–99)
Glucose-Capillary: 269 mg/dL — ABNORMAL HIGH (ref 70–99)
Glucose-Capillary: 210 mg/dL — ABNORMAL HIGH (ref 70–99)
Glucose-Capillary: 169 mg/dL — ABNORMAL HIGH (ref 70–99)

## 2019-03-30 LAB — FERRITIN: Ferritin: 75 ng/mL (ref 11–307)

## 2019-03-30 LAB — D-DIMER, QUANTITATIVE
D-Dimer, Quant: 0.8 ug/mL-FEU — ABNORMAL HIGH (ref 0.00–0.50)
D-Dimer, Quant: 0.91 ug/mL-FEU — ABNORMAL HIGH (ref 0.00–0.50)

## 2019-03-30 LAB — C-REACTIVE PROTEIN: CRP: 1.1 mg/dL — ABNORMAL HIGH (ref <1.0)

## 2019-03-30 LAB — MAGNESIUM: Magnesium: 1.8 mg/dL (ref 1.7–2.4)

## 2019-03-30 MED ORDER — SODIUM POLYSTYRENE SULFONATE 15 GM/60ML PO SUSP
30.0000 g | Freq: Once | ORAL | Status: AC
  Administered 2019-03-30: 16:00:00 30 g via ORAL
  Filled 2019-03-30: qty 120

## 2019-03-30 MED ORDER — FUROSEMIDE 10 MG/ML IJ SOLN
40.0000 mg | Freq: Once | INTRAMUSCULAR | Status: AC
  Administered 2019-03-30: 40 mg via INTRAVENOUS
  Filled 2019-03-30: qty 4

## 2019-03-30 NOTE — Progress Notes (Signed)
Received call from patient's sister, Chauncy Passy.  Sister was confused because yesterday she was told patient would possibly be discharged today and returned to SNF.  Ms. Jeanell Sparrow stated she has not been updated on her sister's progress in more than 24 hours.  This RN apologized for the lack of communication and assured her a note would be left for the MD to call and update her with sister's progress and discharge information.

## 2019-03-30 NOTE — Evaluation (Signed)
Physical Therapy Evaluation Patient Details Name: Meghan Patterson MRN: [id number] DOB: December 06, 1960 Today's Date: 03/30/2019   History of Present Illness  Pt adm with acute hypoxic respiratory failure due to Covid 19. PMH - RA; intellectual disability; hypothyroidism; HTN; HLD; CHF; COPD; and DM  Clinical Impression  Pt admitted with above diagnosis and presents to PT with functional limitations due to deficits listed below (See PT problem list). Pt needs skilled PT to maximize independence and safety to allow discharge back to SNF at prior level of care. Expect pt is close to baseline for mobility. Pt on O2 during eval with SpO2 >90% except when bed placed in trendelenburg for pt to scoot up in bed with SpO2 dipping briefly to 87%.      Follow Up Recommendations SNF(at prior level of care)    Equipment Recommendations  None recommended by PT    Recommendations for Other Services       Precautions / Restrictions Precautions Precautions: Fall Restrictions Weight Bearing Restrictions: No      Mobility  Bed Mobility Overal bed mobility: Needs Assistance Bed Mobility: Supine to Sit;Sit to Supine     Supine to sit: Min assist Sit to supine: Min assist   General bed mobility comments: assist to elevate trunk into sitting and bring hips to EOB. Assist to bring legs back up into bed  Transfers                 General transfer comment: Pt refused  Ambulation/Gait                Stairs            Wheelchair Mobility    Modified Rankin (Stroke Patients Only)       Balance Overall balance assessment: Needs assistance Sitting-balance support: No upper extremity supported;Feet supported Sitting balance-Leahy Scale: Fair                                       Pertinent Vitals/Pain Pain Assessment: No/denies pain    Home Living Family/patient expects to be discharged to:: Skilled nursing facility                      Prior  Function Level of Independence: Needs assistance   Gait / Transfers Assistance Needed: Per pt she uses w/c primarily. Unsure if pt requires assist to transfer to/from w/c           Hand Dominance        Extremity/Trunk Assessment   Upper Extremity Assessment Upper Extremity Assessment: Generalized weakness    Lower Extremity Assessment Lower Extremity Assessment: Generalized weakness    Cervical / Trunk Assessment Cervical / Trunk Assessment: Other exceptions(obese)  Communication   Communication: Other (comment)(soft voice which is hard to hear)  Cognition Arousal/Alertness: Awake/alert Behavior During Therapy: WFL for tasks assessed/performed Overall Cognitive Status: History of cognitive impairments - at baseline                                        General Comments      Exercises     Assessment/Plan    PT Assessment Patient needs continued PT services  PT Problem List Decreased strength;Decreased activity tolerance;Decreased balance;Decreased mobility;Obesity       PT Treatment Interventions DME instruction;Gait training;Functional  mobility training;Therapeutic activities;Therapeutic exercise;Balance training;Patient/family education    PT Goals (Current goals can be found in the Care Plan section)  Acute Rehab PT Goals Patient Stated Goal: not stated PT Goal Formulation: With patient Time For Goal Achievement: 04/13/19 Potential to Achieve Goals: Good    Frequency Min 2X/week   Barriers to discharge        Co-evaluation               AM-PAC PT "6 Clicks" Mobility  Outcome Measure Help needed turning from your back to your side while in a flat bed without using bedrails?: A Little Help needed moving from lying on your back to sitting on the side of a flat bed without using bedrails?: A Little Help needed moving to and from a bed to a chair (including a wheelchair)?: A Little Help needed standing up from a chair using your  arms (e.g., wheelchair or bedside chair)?: A Little Help needed to walk in hospital room?: Total Help needed climbing 3-5 steps with a railing? : Total 6 Click Score: 14    End of Session Equipment Utilized During Treatment: Oxygen Activity Tolerance: Other (comment)(self limiting) Patient left: in bed;with call bell/phone within reach;with bed alarm set Nurse Communication: Mobility status PT Visit Diagnosis: Other abnormalities of gait and mobility (R26.89);Muscle weakness (generalized) (M62.81)    Time: 0865-7846 PT Time Calculation (min) (ACUTE ONLY): 17 min   Charges:   PT Evaluation $PT Eval Moderate Complexity: [place] Pager [phone] Office [place] 03/30/2019, 10:58 AM

## 2019-03-30 NOTE — Progress Notes (Signed)
PROGRESS NOTE                                                                                                                                                                                                             Patient Demographics:    Meghan Patterson, is a 58 y.o. female, DOB - 11-06-1960, KAJ:[id number]  Outpatient Primary MD for the patient is Fischer, Karn Pickler, DO    LOS - 2  Admit date - 03/28/2019    Chief Complaint  Patient presents with  . Shortness of Breath       Brief Narrative  Meghan Patterson is a 58 y.o. female with medical history significant of RA; intellectual disability; hypothyroidism; HTN; HLD; CHF; COPD on 2 L nasal cannula oxygen; and DM presenting with worsening SOB associated with COVID infection, she was found to be profoundly hypoxic requiring oxygen in the ER and admitted for further care for COVID-19 pneumonitis related acute hypoxic respiratory failure.   Subjective:   Patient in bed, appears comfortable, denies any headache, no fever, no chest pain or pressure, no shortness of breath , no abdominal pain. No focal weakness.   Assessment  & Plan :     1. Acute Hypoxic Resp. Failure due to Acute Covid 19 Viral Illness during the ongoing 2020 Covid 19 Pandemic - she was profoundly hypoxic upon admission, inflammatory markers were borderline elevated, she was promptly started on IV steroids along with REMDESVIR.  Clinically much improved continue and complete IV REMDESIVIR course, continue to encourage activity use flutter valve and I-S, sit up in chair and daytime.  She uses 2 L nasal cannula oxygen at baseline.  COVID-19 Labs  Recent Labs    03/28/19 1107  03/29/19 0400 03/29/19 1707 03/30/19 0500  DDIMER 2.24*   < > 1.32* 1.23* 0.80*  FERRITIN 213  --  101  --  75  LDH 177  --   --   --   --   CRP 3.7*  --  2.8*  --  1.1*   < > = values in this interval not displayed.    No  results found for: SARSCOV2NAA   Hepatic Function Latest Ref Rng & Units 03/30/2019 03/29/2019 03/28/2019  Total Protein 6.5 - 8.1 g/dL 6.0(L) 6.5 6.4(L)  Albumin 3.5 - 5.0 g/dL 2.7(L) 2.9(L) 3.0(L)  AST  15 - 41 U/L _0 ALT 0 - 44 U/L _1 Alk Phosphatase 38 - 126 U/L 63 58 53  Total Bilirubin 0.3 - 1.2 mg/dL 0.2(L) 0.3 0.8        Component Value Date/Time   BNP 504.7 (H) 03/28/2019 0214      2.  Underlying COPD.  Minimal to no wheezing.  She is on 2 L nasal cannula at SNF.  Continue supportive care.  3.  Chronic diastolic CHF with EF 35% in October 2016.  She is compensated continue home dose beta-blocker and diuretic.  4.  Mildly elevated flat trend troponin upon admission.  Due to demand mismatch caused by profound hypoxia, no chest pain, EKG unchanged, trend was flat not an ACS pattern, continue combination of statin and beta-blocker, low-dose aspirin added for secondary prevention.  5. Hypothyroidism.  Home dose Synthroid.  Stable TSH.  6.  Chronic pain.  On Duragesic patch continue.  7.  Dyslipidemia.  On statin.  8.  Essential hypertension.  On combination of beta-blocker and ARB.  9.  Intellectual disability.  SNF upon discharge.  Sister updated.  10.  DM type II.  In reasonable control.  Continue moderate SSI along with low-dose Lantus as she is on steroids for now.  Lab Results  Component Value Date   HGBA1C 5.9 (H) 03/28/2019   CBG (last 3)  Recent Labs    03/29/19 1613 03/29/19 2217 03/30/19 0736  GLUCAP 176* 256* 189*       Condition - Extremely Guarded  Family Communication  :  Sister on 03/29/19  Code Status :  DNR  Diet :   Diet Order            Diet Carb Modified Fluid consistency: Thin; Room service appropriate? Yes  Diet effective now               Disposition Plan  :  SNF  Consults  :  None  Procedures  :  None  PUD Prophylaxis :  PPI  DVT Prophylaxis  :  Lovenox   Lab Results  Component Value Date   PLT 242  03/30/2019    Inpatient Medications  Scheduled Meds: . acidophilus  1 capsule Oral Daily  . aspirin  81 mg Oral Daily  . atorvastatin  80 mg Oral QHS  . chlorhexidine  15 mL Mouth Rinse BID  . citalopram  10 mg Oral Daily  . cycloSPORINE  1 drop Both Eyes BID  . dicyclomine  20 mg Oral TID AC  . docusate sodium  100 mg Oral BID  . enoxaparin (LOVENOX) injection  50 mg Subcutaneous BID  . fentaNYL  1 patch Transdermal Q72H  . fluconazole  100 mg Oral Daily  . furosemide  20 mg Oral Daily  . Gerhardt's butt cream   Topical TID  . insulin aspart  0-15 Units Subcutaneous TID WC  . insulin aspart  0-5 Units Subcutaneous QHS  . insulin glargine  15 Units Subcutaneous Daily  . levothyroxine  150 mcg Oral QAC breakfast  . losartan  50 mg Oral Daily  . mouth rinse  15 mL Mouth Rinse q12n4p  . Melatonin  5 mg Oral QHS  . methylPREDNISolone (SOLU-MEDROL) injection  40 mg Intravenous Q12H  . metoprolol succinate  25 mg Oral Daily  . mometasone-formoterol  2 puff Inhalation BID  . pantoprazole  40 mg Oral QODAY  . traZODone  50 mg Oral QHS   Continuous  Infusions: . remdesivir 100 mg in NS 250 mL Stopped (03/29/19 1734)   PRN Meds:.acetaminophen, albuterol, bisacodyl, [DISCONTINUED] ondansetron **OR** ondansetron (ZOFRAN) IV, oxyCODONE, polyethylene glycol  Antibiotics  :    Anti-infectives (From admission, onward)   Start     Dose/Rate Route Frequency Ordered Stop   03/29/19 1530  remdesivir 100 mg in sodium chloride 0.9 % 230 mL IVPB     100 mg 500 mL/hr over 30 Minutes Intravenous Every 24 hours 03/28/19 1411 04/02/19 1529   03/28/19 1800  fluconazole (DIFLUCAN) tablet 100 mg     100 mg Oral Daily 03/28/19 1720     03/28/19 1530  remdesivir 200 mg in sodium chloride 0.9 % 210 mL IVPB     200 mg 500 mL/hr over 30 Minutes Intravenous Once 03/28/19 1411 03/29/19 0846       Time Spent in minutes  30   Lala Lund M.D on 03/30/2019 at 10:03 AM  To page go to [email or web address] -  password [email or web address]  [facility] -  Office  [phone]    See all Orders from today for further details    Objective:   Vitals:   03/29/19 1959 03/30/19 0015 03/30/19 0022 03/30/19 0739  BP: (!) 149/90 (!) 141/77 (!) 145/94 129/82  Pulse: 78 75  70  Resp:  _0 Temp: 99.2 F (37.3 C) 97.8 F (36.6 C) 98.1 F (36.7 C) (!) 97.5 F (36.4 C)  TempSrc: Oral Oral Oral Axillary  SpO2: 97% 96%  94%  Weight:      Height:        Wt Readings from Last 3 Encounters:  03/28/19 93.3 kg  02/28/17 124.3 kg  01/20/17 124.5 kg     Intake/Output Summary (Last 24 hours) at 03/30/2019 1003 Last data filed at 03/30/2019 0915 Gross per 24 hour  Intake 1193.11 ml  Output 400 ml  Net 793.11 ml     Physical Exam  Awake Alert, Oriented X 3, No new F.N deficits, Normal affect [place].AT,PERRAL Supple Neck,No JVD, No cervical lymphadenopathy appriciated.  Symmetrical Chest wall movement, Good air movement bilaterally, CTAB RRR,No Gallops, Rubs or new Murmurs, No Parasternal Heave +ve B.Sounds, Abd Soft, No tenderness, No organomegaly appriciated, No rebound - guarding or rigidity. No Cyanosis, Clubbing or edema, No new Rash or bruise, chronic left wrist splint    Data Review:    CBC Recent Labs  Lab 03/27/19 1550 03/28/19 0214 03/29/19 0400 03/30/19 0500  WBC 8.5 10.1 4.1 4.6  HGB 11.7* 11.3* 10.9* 10.2*  HCT 42.8 40.3 39.4 37.0  PLT 231 246 266 242  MCV 97.1 95.5 95.6 95.1  MCH 26.5 26.8 26.5 26.2  MCHC 27.3* 28.0* 27.7* 27.6*  RDW 13.6 13.9 14.1 14.0  LYMPHSABS 0.9  --  0.5* 0.6*  MONOABS 0.6  --  0.1 0.2  EOSABS 0.0  --  0.0 0.0  BASOSABS 0.0  --  0.0 0.0    Chemistries  Recent Labs  Lab 03/27/19 1550 03/28/19 0214 03/29/19 0400 03/30/19 0500  NA 142 142 143 142  K 4.3 4.2 4.7 5.5*  CL 90* 89* 87* 87*  CO2 39* 38* 45* 42*  GLUCOSE 109* 160* 204* 185*  BUN 18 18 23* 31*  CREATININE 0.51 0.68 0.43* 0.51  CALCIUM 8.6* 8.4* 8.4* 8.4*  MG  --   --  1.5*  1.8  AST  --  _1 ALT  --  _2 ALKPHOS  --  53  58 63  BILITOT  --  0.8 0.3 0.2*   ------------------------------------------------------------------------------------------------------------------ No results for input(s): CHOL, HDL, LDLCALC, TRIG, CHOLHDL, LDLDIRECT in the last 72 hours.  Lab Results  Component Value Date   HGBA1C 5.9 (H) 03/28/2019   ------------------------------------------------------------------------------------------------------------------ Recent Labs    03/28/19 1107  TSH 0.609    Cardiac Enzymes Recent Labs  Lab 03/28/19 0214 03/28/19 0717  TROPONINI 0.19* 0.20*   ------------------------------------------------------------------------------------------------------------------    Component Value Date/Time   BNP 504.7 (H) 03/28/2019 0214    Micro Results Recent Results (from the past 240 hour(s))  MRSA PCR Screening     Status: None   Collection Time: 03/28/19  4:20 PM   Specimen: Nasal Mucosa; Nasopharyngeal  Result Value Ref Range Status   MRSA by PCR NEGATIVE NEGATIVE Final    Comment:        The GeneXpert MRSA Assay (FDA approved for NASAL specimens only), is one component of a comprehensive MRSA colonization surveillance program. It is not intended to diagnose MRSA infection nor to guide or monitor treatment for MRSA infections. Performed at [facility], [place] [address], [place],  [address]     Radiology Reports Dg Chest Port 1 View  Result Date: 03/28/2019 CLINICAL DATA:  58 year old patient with fever cough and shortness of breath. EXAM: PORTABLE CHEST ONE VIEW COMPARISON:  03/27/2019. FINDINGS: Semi upright AP portable view at 0207 hours. Stable lung volumes and mediastinal contours. Stable patchy opacity at the lung bases with no pneumothorax, pulmonary edema or definite effusion. Negative tracheal air column. Nonobstructed visible bowel gas pattern. Stable visible osseous structures.  IMPRESSION: Unchanged portable appearance of the chest since yesterday. Electronically Signed   By: Genevie Ann M.D.   On: 03/28/2019 03:51   Dg Chest Port 1 View  Result Date: 03/27/2019 CLINICAL DATA:  Dyspnea EXAM: PORTABLE CHEST 1 VIEW COMPARISON:  07/17/2015 chest radiograph. FINDINGS: Low lung volumes. Stable cardiomediastinal silhouette with normal heart size. No pneumothorax. Probable small bilateral pleural effusions. Patchy bibasilar lung opacities. IMPRESSION: Low lung volumes with patchy bibasilar lung opacities, which could represent pneumonia, aspiration and/or atelectasis. Probable small bilateral pleural effusions. Electronically Signed   By: Ilona Sorrel M.D.   On: 03/27/2019 14:20

## 2019-03-31 LAB — CBC WITH DIFFERENTIAL/PLATELET
Abs Immature Granulocytes: 0.46 10*3/uL — ABNORMAL HIGH (ref 0.00–0.07)
Basophils Absolute: 0 10*3/uL (ref 0.0–0.1)
Basophils Relative: 0 %
Eosinophils Absolute: 0 10*3/uL (ref 0.0–0.5)
Eosinophils Relative: 0 %
HCT: 42.9 % (ref 36.0–46.0)
Hemoglobin: 11.5 g/dL — ABNORMAL LOW (ref 12.0–15.0)
Immature Granulocytes: 7 %
Lymphocytes Relative: 10 %
Lymphs Abs: 0.7 10*3/uL (ref 0.7–4.0)
MCH: 26.5 pg (ref 26.0–34.0)
MCHC: 26.8 g/dL — ABNORMAL LOW (ref 30.0–36.0)
MCV: 98.8 fL (ref 80.0–100.0)
Monocytes Absolute: 0.4 10*3/uL (ref 0.1–1.0)
Monocytes Relative: 6 %
Neutro Abs: 5.1 10*3/uL (ref 1.7–7.7)
Neutrophils Relative %: 77 %
Platelets: 286 10*3/uL (ref 150–400)
RBC: 4.34 MIL/uL (ref 3.87–5.11)
RDW: 14.7 % (ref 11.5–15.5)
WBC: 6.7 10*3/uL (ref 4.0–10.5)
nRBC: 0 % (ref 0.0–0.2)

## 2019-03-31 LAB — COMPREHENSIVE METABOLIC PANEL
ALT: 12 U/L (ref 0–44)
AST: 13 U/L — ABNORMAL LOW (ref 15–41)
Albumin: 2.9 g/dL — ABNORMAL LOW (ref 3.5–5.0)
Alkaline Phosphatase: 64 U/L (ref 38–126)
Anion gap: 11 (ref 5–15)
BUN: 34 mg/dL — ABNORMAL HIGH (ref 6–20)
CO2: 48 mmol/L — ABNORMAL HIGH (ref 22–32)
Calcium: 8.8 mg/dL — ABNORMAL LOW (ref 8.9–10.3)
Chloride: 85 mmol/L — ABNORMAL LOW (ref 98–111)
Creatinine, Ser: 0.58 mg/dL (ref 0.44–1.00)
GFR calc Af Amer: >60 mL/min (ref >60)
GFR calc non Af Amer: >60 mL/min (ref >60)
Glucose, Bld: 271 mg/dL — ABNORMAL HIGH (ref 70–99)
Potassium: 5.3 mmol/L — ABNORMAL HIGH (ref 3.5–5.1)
Sodium: 144 mmol/L (ref 135–145)
Total Bilirubin: 0.2 mg/dL — ABNORMAL LOW (ref 0.3–1.2)
Total Protein: 6.4 g/dL — ABNORMAL LOW (ref 6.5–8.1)

## 2019-03-31 LAB — MAGNESIUM: Magnesium: 1.8 mg/dL (ref 1.7–2.4)

## 2019-03-31 LAB — C-REACTIVE PROTEIN: CRP: 0.8 mg/dL (ref <1.0)

## 2019-03-31 LAB — GLUCOSE, CAPILLARY
Glucose-Capillary: 170 mg/dL — ABNORMAL HIGH (ref 70–99)
Glucose-Capillary: 185 mg/dL — ABNORMAL HIGH (ref 70–99)
Glucose-Capillary: 233 mg/dL — ABNORMAL HIGH (ref 70–99)

## 2019-03-31 LAB — D-DIMER, QUANTITATIVE: D-Dimer, Quant: 0.7 ug/mL-FEU — ABNORMAL HIGH (ref 0.00–0.50)

## 2019-03-31 LAB — FERRITIN: Ferritin: 78 ng/mL (ref 11–307)

## 2019-03-31 MED ORDER — BISACODYL 5 MG PO TBEC
10.0000 mg | DELAYED_RELEASE_TABLET | Freq: Every day | ORAL | Status: DC
  Administered 2019-04-01 – 2019-04-02 (×2): 10 mg via ORAL
  Filled 2019-03-31 (×2): qty 2

## 2019-03-31 MED ORDER — FUROSEMIDE 10 MG/ML IJ SOLN
40.0000 mg | Freq: Once | INTRAMUSCULAR | Status: AC
  Administered 2019-03-31: 40 mg via INTRAVENOUS
  Filled 2019-03-31: qty 4

## 2019-03-31 MED ORDER — LACTATED RINGERS IV SOLN
INTRAVENOUS | Status: AC
  Administered 2019-03-31: 08:00:00 via INTRAVENOUS

## 2019-03-31 MED ORDER — SODIUM POLYSTYRENE SULFONATE 15 GM/60ML PO SUSP
30.0000 g | Freq: Once | ORAL | Status: AC
  Administered 2019-03-31: 30 g via ORAL
  Filled 2019-03-31: qty 120

## 2019-03-31 MED ORDER — BISACODYL 5 MG PO TBEC
10.0000 mg | DELAYED_RELEASE_TABLET | Freq: Once | ORAL | Status: AC
  Administered 2019-03-31: 10 mg via ORAL
  Filled 2019-03-31: qty 2

## 2019-03-31 MED ORDER — METHYLPREDNISOLONE SODIUM SUCC 40 MG IJ SOLR
40.0000 mg | Freq: Every day | INTRAMUSCULAR | Status: DC
  Administered 2019-03-31 – 2019-04-01 (×2): 40 mg via INTRAVENOUS
  Filled 2019-03-31 (×2): qty 1

## 2019-03-31 NOTE — [place] FL2 (Signed)
[place] MEDICAID FL2 LEVEL OF CARE SCREENING TOOL     IDENTIFICATION  Patient Name: Meghan Patterson Birthdate: 04/28/1961 Sex: female Admission Date (Current Location): 03/28/2019  [facility] [place]   Facility and Address:  [facility], [address], [place], [place] [address]([id number])      Provider Number: [id number]  Attending Physician Name and Address:  Leroy SeaSingh, Prashant K, MD  Relative Name and Phone Number:       Current Level of Care: Hospital Recommended Level of Care: Skilled Nursing Facility Prior Approval Number:    Date Approved/Denied:   PASRR Number: [id number]  Discharge Plan: SNF    Current Diagnoses: Patient Active Problem List   Diagnosis Date Noted  . Acute respiratory disease due to COVID-19 virus 03/28/2019  . Chronic respiratory failure with hypoxia (HCC) 09/30/2016  . Chronic acquired lymphedema 09/30/2016  . Moderate intellectual disabilities 07/14/2016  . PVD (peripheral vascular disease) (HCC) 07/14/2016  . Vitamin D deficiency 06/15/2016  . Dry eyes 06/15/2016  . Rheumatoid arthritis, unspecified (HCC) 12/21/2015  . Major depression, chronic 10/26/2015  . Chronic diastolic CHF (congestive heart failure) (HCC) 10/26/2015  . Acute on chronic systolic CHF (congestive heart failure) (HCC) 07/17/2015  . GERD (gastroesophageal reflux disease) 07/17/2015  . HTN (hypertension) 07/17/2015  . Hypothyroid 07/17/2015  . Type 2 diabetes mellitus with peripheral vascular disease (HCC) 07/17/2015  . HLD (hyperlipidemia) 07/17/2015    Orientation RESPIRATION BLADDER Height & Weight     Self, Time, Situation, Place  O2([place] 2L) Incontinent Weight: 205 lb 9.6 oz (93.3 kg) Height:  5\' 2"  (157.5 cm)  BEHAVIORAL SYMPTOMS/MOOD NEUROLOGICAL BOWEL NUTRITION STATUS      Incontinent Diet(see DC summary)  AMBULATORY STATUS COMMUNICATION OF NEEDS Skin   Limited Assist Verbally Normal                        Personal Care Assistance Level of Assistance  Bathing, Feeding, Dressing Bathing Assistance: Limited assistance Feeding assistance: Independent Dressing Assistance: Limited assistance     Functional Limitations Info  Sight, Hearing, Speech Sight Info: Impaired(wears glasses) Hearing Info: Adequate Speech Info: Adequate    SPECIAL CARE FACTORS FREQUENCY                       Contractures Contractures Info: Not present    Additional Factors Info  Code Status, Allergies, Psychotropic, Insulin Sliding Scale Code Status Info: DNR Allergies Info: Bee Venom Psychotropic Info: Celexa 10mg  daily Insulin Sliding Scale Info: 0-15 units 3x/day with meals; 0-5 units daily at bed; Lantus 15 units daily at 10 am       Current Medications (03/31/2019):  This is the current hospital active medication list Current Facility-Administered Medications  Medication Dose Route Frequency Provider Last Rate Last Dose  . acetaminophen (TYLENOL) tablet 650 mg  650 mg Oral Q6H PRN Standley BrookingGoodrich, Daniel P, MD   650 mg at 03/30/19 1623  . acidophilus (RISAQUAD) capsule 1 capsule  1 capsule Oral Daily Standley BrookingGoodrich, Daniel P, MD   1 capsule at 03/30/19 0816  . albuterol (VENTOLIN HFA) 108 (90 Base) MCG/ACT inhaler 2 puff  2 puff Inhalation Q4H PRN Standley BrookingGoodrich, Daniel P, MD      . aspirin chewable tablet 81 mg  81 mg Oral Daily Standley BrookingGoodrich, Daniel P, MD   81 mg at 03/30/19 0815  . atorvastatin (LIPITOR) tablet 80 mg  80 mg Oral QHS Standley BrookingGoodrich, Daniel P,  MD   80 mg at 03/30/19 2138  . bisacodyl (DULCOLAX) EC tablet 10 mg  10 mg Oral Once Leroy Sea, MD      . Melene Muller ON 04/01/2019] bisacodyl (DULCOLAX) EC tablet 10 mg  10 mg Oral Daily Leroy Sea, MD      . chlorhexidine (PERIDEX) 0.12 % solution 15 mL  15 mL Mouth Rinse BID Standley Brooking, MD   15 mL at 03/30/19 2136  . citalopram (CELEXA) tablet 10 mg  10 mg Oral Daily Standley Brooking, MD   10 mg at 03/30/19 0816  . cycloSPORINE (RESTASIS) 0.05 %  ophthalmic emulsion 1 drop  1 drop Both Eyes BID Standley Brooking, MD   1 drop at 03/30/19 2137  . dicyclomine (BENTYL) tablet 20 mg  20 mg Oral TID AC Standley Brooking, MD   20 mg at 03/30/19 1624  . docusate sodium (COLACE) capsule 100 mg  100 mg Oral BID Standley Brooking, MD   100 mg at 03/30/19 2139  . enoxaparin (LOVENOX) injection 50 mg  50 mg Subcutaneous BID Standley Brooking, MD   50 mg at 03/30/19 2137  . fentaNYL (DURAGESIC) 12 MCG/HR 1 patch  1 patch Transdermal Q72H Standley Brooking, MD   1 patch at 03/28/19 1619  . fluconazole (DIFLUCAN) tablet 100 mg  100 mg Oral Daily Standley Brooking, MD   100 mg at 03/30/19 7782  . furosemide (LASIX) injection 40 mg  40 mg Intravenous Once Leroy Sea, MD      . Gerhardt's butt cream   Topical TID Standley Brooking, MD   1 application at 03/30/19 2140  . insulin aspart (novoLOG) injection 0-15 Units  0-15 Units Subcutaneous TID WC Standley Brooking, MD   8 Units at 03/30/19 1654  . insulin aspart (novoLOG) injection 0-5 Units  0-5 Units Subcutaneous QHS Standley Brooking, MD   3 Units at 03/29/19 2221  . insulin glargine (LANTUS) injection 15 Units  15 Units Subcutaneous Daily Standley Brooking, MD   15 Units at 03/30/19 0825  . lactated ringers infusion   Intravenous Continuous Leroy Sea, MD      . levothyroxine (SYNTHROID) tablet 150 mcg  150 mcg Oral QAC breakfast Standley Brooking, MD   150 mcg at 03/31/19 0528  . MEDLINE mouth rinse  15 mL Mouth Rinse q12n4p Standley Brooking, MD   15 mL at 03/30/19 1624  . Melatonin TABS 5 mg  5 mg Oral QHS Standley Brooking, MD   5 mg at 03/30/19 2136  . methylPREDNISolone sodium succinate (SOLU-MEDROL) 40 mg/mL injection 40 mg  40 mg Intravenous Daily Susa Raring K, MD      . metoprolol succinate (TOPROL-XL) 24 hr tablet 25 mg  25 mg Oral Daily Standley Brooking, MD   25 mg at 03/30/19 0825  . mometasone-formoterol (DULERA) 200-5 MCG/ACT inhaler 2 puff  2 puff Inhalation  BID Standley Brooking, MD   2 puff at 03/30/19 2136  . ondansetron (ZOFRAN) injection 4 mg  4 mg Intravenous Q6H PRN Standley Brooking, MD      . pantoprazole (PROTONIX) EC tablet 40 mg  40 mg Oral Debbora Presto, MD   40 mg at 03/29/19 0856  . polyethylene glycol (MIRALAX / GLYCOLAX) packet 17 g  17 g Oral Daily PRN Standley Brooking, MD      . remdesivir 100 mg in sodium chloride 0.9 %  230 mL IVPB  100 mg Intravenous Q24H Samuella Cota, MD 500 mL/hr at 03/30/19 1415 100 mg at 03/30/19 1415  . sodium polystyrene (KAYEXALATE) 15 GM/60ML suspension 30 g  30 g Oral Once Thurnell Lose, MD      . traZODone (DESYREL) tablet 50 mg  50 mg Oral QHS Samuella Cota, MD   50 mg at 03/30/19 2138     Discharge Medications: Please see discharge summary for a list of discharge medications.  Relevant Imaging Results:  Relevant Lab Results:   Additional Information SS#: [id number]  Geralynn Ochs, LCSW

## 2019-03-31 NOTE — Progress Notes (Signed)
PROGRESS NOTE                                                                                                                                                                                                             Patient Demographics:    Meghan Patterson, is a 58 y.o. female, DOB - 10/17/1961, NID:[id number]  Outpatient Primary MD for the patient is Fischer, Karn Pickler, DO    LOS - 3  Admit date - 03/28/2019    Chief Complaint  Patient presents with  . Shortness of Breath       Brief Narrative  Meghan Patterson is a 58 y.o. female with medical history significant of RA; intellectual disability; hypothyroidism; HTN; HLD; CHF; COPD on 2 L nasal cannula oxygen; and DM presenting with worsening SOB associated with COVID infection, she was found to be profoundly hypoxic requiring oxygen in the ER and admitted for further care for COVID-19 pneumonitis related acute hypoxic respiratory failure.   Subjective:   Patient in bed, appears comfortable, denies any headache, no fever, no chest pain or pressure, no shortness of breath , no abdominal pain. No focal weakness.   Assessment  & Plan :     1. Acute Hypoxic Resp. Failure due to Acute Covid 19 Viral Illness during the ongoing 2020 Covid 19 Pandemic - she was profoundly hypoxic upon admission, inflammatory markers were borderline elevated, she was promptly started on IV steroids along with REMDESVIR.  Clinically much improved continue and complete IV REMDESIVIR course, continue to encourage activity use flutter valve and I-S, sit up in chair and daytime.  She uses 2 L nasal cannula oxygen at baseline.  COVID-19 Labs  Recent Labs    03/28/19 1107  03/29/19 0400  03/30/19 0500 03/30/19 1640 03/31/19 0300  DDIMER 2.24*   < > 1.32*   < > 0.80* 0.91* 0.70*  FERRITIN 213  --  101  --  75  --  78  LDH 177  --   --   --   --   --   --   CRP 3.7*  --  2.8*  --  1.1*  --  0.8   < > = values in this interval not displayed.    No results found for: SARSCOV2NAA   Hepatic Function Latest Ref Rng & Units 03/31/2019 03/30/2019 03/29/2019  Total Protein 6.5 - 8.1 g/dL 6.4(L) 6.0(L) 6.5  Albumin 3.5 - 5.0 g/dL 2.9(L) 2.7(L) 2.9(L)  AST 15 - 41 U/L 13(L) 16 19  ALT 0 - 44 U/L _0 Alk Phosphatase 38 - 126 U/L 64 63 58  Total Bilirubin 0.3 - 1.2 mg/dL 0.2(L) 0.2(L) 0.3        Component Value Date/Time   BNP 504.7 (H) 03/28/2019 0214      2.  Underlying COPD.  Minimal to no wheezing.  She is on 2 L nasal cannula at SNF.  Continue supportive care.  3.  Chronic diastolic CHF with EF 47% in October 2016.  She is compensated continue home dose beta-blocker and diuretic.  4.  Mildly elevated flat trend troponin upon admission.  Due to demand mismatch caused by profound hypoxia, no chest pain, EKG unchanged, trend was flat not an ACS pattern, continue combination of statin and beta-blocker, low-dose aspirin added for secondary prevention.  5. Hypothyroidism.  Home dose Synthroid.  Stable TSH.  6.  Chronic pain.  On Duragesic patch continue.  7.  Dyslipidemia.  On statin.  8.  Essential hypertension.  For now continue beta-blocker and hold ARB due to hyperkalemia.  Monitor.  9.  Intellectual disability.  SNF upon discharge.  Sister updated.  10.  Hyperkalemia.  Stop ARB, Kayexalate, IV fluid and Lasix.  Repeat in the morning.  11.  DM type II.  In reasonable control.  Continue moderate SSI along with low-dose Lantus as she is on steroids for now.  Lab Results  Component Value Date   HGBA1C 5.9 (H) 03/28/2019   CBG (last 3)  Recent Labs    03/30/19 1632 03/30/19 2053 03/31/19 0808  GLUCAP 210* 169* 170*       Condition - Extremely Guarded  Family Communication  :  Sister on 03/29/19  Code Status :  DNR  Diet :   Diet Order            Diet Carb Modified Fluid consistency: Thin; Room service appropriate? Yes  Diet effective now                Disposition Plan  :  SNF  Consults  :  None  Procedures  :  None  PUD Prophylaxis :  PPI  DVT Prophylaxis  :  Lovenox   Lab Results  Component Value Date   PLT 286 03/31/2019    Inpatient Medications  Scheduled Meds: . acidophilus  1 capsule Oral Daily  . aspirin  81 mg Oral Daily  . atorvastatin  80 mg Oral QHS  . bisacodyl  10 mg Oral Once  . [START ON 04/01/2019] bisacodyl  10 mg Oral Daily  . chlorhexidine  15 mL Mouth Rinse BID  . citalopram  10 mg Oral Daily  . cycloSPORINE  1 drop Both Eyes BID  . dicyclomine  20 mg Oral TID AC  . docusate sodium  100 mg Oral BID  . enoxaparin (LOVENOX) injection  50 mg Subcutaneous BID  . fentaNYL  1 patch Transdermal Q72H  . fluconazole  100 mg Oral Daily  . furosemide  40 mg Intravenous Once  . Gerhardt's butt cream   Topical TID  . insulin aspart  0-15 Units Subcutaneous TID WC  . insulin aspart  0-5 Units Subcutaneous QHS  . insulin glargine  15 Units Subcutaneous Daily  . levothyroxine  150 mcg Oral QAC breakfast  . mouth rinse  15 mL Mouth Rinse q12n4p  .  Melatonin  5 mg Oral QHS  . methylPREDNISolone (SOLU-MEDROL) injection  40 mg Intravenous Daily  . metoprolol succinate  25 mg Oral Daily  . mometasone-formoterol  2 puff Inhalation BID  . pantoprazole  40 mg Oral QODAY  . sodium polystyrene  30 g Oral Once  . traZODone  50 mg Oral QHS   Continuous Infusions: . lactated ringers    . remdesivir 100 mg in NS 250 mL 100 mg (03/30/19 1415)   PRN Meds:.acetaminophen, albuterol, [DISCONTINUED] ondansetron **OR** ondansetron (ZOFRAN) IV, polyethylene glycol  Antibiotics  :    Anti-infectives (From admission, onward)   Start     Dose/Rate Route Frequency Ordered Stop   03/29/19 1530  remdesivir 100 mg in sodium chloride 0.9 % 230 mL IVPB     100 mg 500 mL/hr over 30 Minutes Intravenous Every 24 hours 03/28/19 1411 04/02/19 1529   03/28/19 1800  fluconazole (DIFLUCAN) tablet 100 mg     100 mg Oral Daily 03/28/19  1720     03/28/19 1530  remdesivir 200 mg in sodium chloride 0.9 % 210 mL IVPB     200 mg 500 mL/hr over 30 Minutes Intravenous Once 03/28/19 1411 03/29/19 0846       Time Spent in minutes  30   Lala Lund M.D on 03/31/2019 at 9:39 AM  To page go to [email or web address] - password [facility]  [facility] -  Office  [phone]    See all Orders from today for further details    Objective:   Vitals:   03/31/19 0330 03/31/19 0334 03/31/19 0336 03/31/19 0811  BP:  119/73  128/67  Pulse:   85 81  Resp:   18 13  Temp: (!) 97.5 F (36.4 C)   98 F (36.7 C)  TempSrc: Oral   Oral  SpO2:   95% 96%  Weight:      Height:        Wt Readings from Last 3 Encounters:  03/28/19 93.3 kg  02/28/17 124.3 kg  01/20/17 124.5 kg     Intake/Output Summary (Last 24 hours) at 03/31/2019 0939 Last data filed at 03/31/2019 0830 Gross per 24 hour  Intake 850 ml  Output 1025 ml  Net -175 ml     Physical Exam  Awake Alert, Oriented X 3, No new F.N deficits, Normal affect [place].AT,PERRAL Supple Neck,No JVD, No cervical lymphadenopathy appriciated.  Symmetrical Chest wall movement, Good air movement bilaterally, CTAB RRR,No Gallops, Rubs or new Murmurs, No Parasternal Heave +ve B.Sounds, Abd Soft, No tenderness, No organomegaly appriciated, No rebound - guarding or rigidity. No Cyanosis, Clubbing or edema, chronic left wrist splint    Data Review:    CBC Recent Labs  Lab 03/27/19 1550 03/28/19 0214 03/29/19 0400 03/30/19 0500 03/31/19 0300  WBC 8.5 10.1 4.1 4.6 6.7  HGB 11.7* 11.3* 10.9* 10.2* 11.5*  HCT 42.8 40.3 39.4 37.0 42.9  PLT 231 246 266 242 286  MCV 97.1 95.5 95.6 95.1 98.8  MCH 26.5 26.8 26.5 26.2 26.5  MCHC 27.3* 28.0* 27.7* 27.6* 26.8*  RDW 13.6 13.9 14.1 14.0 14.7  LYMPHSABS 0.9  --  0.5* 0.6* 0.7  MONOABS 0.6  --  0.1 0.2 0.4  EOSABS 0.0  --  0.0 0.0 0.0  BASOSABS 0.0  --  0.0 0.0 0.0    Chemistries  Recent Labs  Lab 03/27/19 1550 03/28/19 0214  03/29/19 0400 03/30/19 0500 03/31/19 0300  NA 142 142 143 142 144  K 4.3 4.2 4.7  5.5* 5.3*  CL 90* 89* 87* 87* 85*  CO2 39* 38* 45* 42* 48*  GLUCOSE 109* 160* 204* 185* 271*  BUN 18 18 23* 31* 34*  CREATININE 0.51 0.68 0.43* 0.51 0.58  CALCIUM 8.6* 8.4* 8.4* 8.4* 8.8*  MG  --   --  1.5* 1.8 1.8  AST  --  _0 13*  ALT  --  _1 ALKPHOS  --  53 58 63 64  BILITOT  --  0.8 0.3 0.2* 0.2*   ------------------------------------------------------------------------------------------------------------------ No results for input(s): CHOL, HDL, LDLCALC, TRIG, CHOLHDL, LDLDIRECT in the last 72 hours.  Lab Results  Component Value Date   HGBA1C 5.9 (H) 03/28/2019   ------------------------------------------------------------------------------------------------------------------ Recent Labs    03/28/19 1107  TSH 0.609    Cardiac Enzymes Recent Labs  Lab 03/28/19 0214 03/28/19 0717  TROPONINI 0.19* 0.20*   ------------------------------------------------------------------------------------------------------------------    Component Value Date/Time   BNP 504.7 (H) 03/28/2019 0214    Micro Results Recent Results (from the past 240 hour(s))  MRSA PCR Screening     Status: None   Collection Time: 03/28/19  4:20 PM   Specimen: Nasal Mucosa; Nasopharyngeal  Result Value Ref Range Status   MRSA by PCR NEGATIVE NEGATIVE Final    Comment:        The GeneXpert MRSA Assay (FDA approved for NASAL specimens only), is one component of a comprehensive MRSA colonization surveillance program. It is not intended to diagnose MRSA infection nor to guide or monitor treatment for MRSA infections. Performed at [facility], [place] [address] [place], [place] [address]     Radiology Reports Dg Chest Port 1 View  Result Date: 03/28/2019 CLINICAL DATA:  58 year old patient with fever cough and shortness of breath. EXAM: PORTABLE CHEST ONE VIEW COMPARISON:   03/27/2019. FINDINGS: Semi upright AP portable view at 0207 hours. Stable lung volumes and mediastinal contours. Stable patchy opacity at the lung bases with no pneumothorax, pulmonary edema or definite effusion. Negative tracheal air column. Nonobstructed visible bowel gas pattern. Stable visible osseous structures. IMPRESSION: Unchanged portable appearance of the chest since yesterday. Electronically Signed   By: Genevie Ann M.D.   On: 03/28/2019 03:51   Dg Chest Port 1 View  Result Date: 03/27/2019 CLINICAL DATA:  Dyspnea EXAM: PORTABLE CHEST 1 VIEW COMPARISON:  07/17/2015 chest radiograph. FINDINGS: Low lung volumes. Stable cardiomediastinal silhouette with normal heart size. No pneumothorax. Probable small bilateral pleural effusions. Patchy bibasilar lung opacities. IMPRESSION: Low lung volumes with patchy bibasilar lung opacities, which could represent pneumonia, aspiration and/or atelectasis. Probable small bilateral pleural effusions. Electronically Signed   By: Ilona Sorrel M.D.   On: 03/27/2019 14:20

## 2019-04-01 LAB — BASIC METABOLIC PANEL
BUN: 33 mg/dL — ABNORMAL HIGH (ref 6–20)
CO2: >50 mmol/L — ABNORMAL HIGH (ref 22–32)
Calcium: 8.5 mg/dL — ABNORMAL LOW (ref 8.9–10.3)
Chloride: 77 mmol/L — ABNORMAL LOW (ref 98–111)
Creatinine, Ser: 0.46 mg/dL (ref 0.44–1.00)
GFR calc Af Amer: >60 mL/min (ref >60)
GFR calc non Af Amer: >60 mL/min (ref >60)
Glucose, Bld: 78 mg/dL (ref 70–99)
Potassium: 4.3 mmol/L (ref 3.5–5.1)
Sodium: 142 mmol/L (ref 135–145)

## 2019-04-01 LAB — GLUCOSE, CAPILLARY
Glucose-Capillary: 241 mg/dL — ABNORMAL HIGH (ref 70–99)
Glucose-Capillary: 76 mg/dL (ref 70–99)
Glucose-Capillary: 229 mg/dL — ABNORMAL HIGH (ref 70–99)
Glucose-Capillary: 186 mg/dL — ABNORMAL HIGH (ref 70–99)

## 2019-04-01 NOTE — TOC Initial Note (Signed)
Transition of Care [facility]) - Initial/Assessment Note    Patient Details  Name: Meghan Patterson MRN: [id number] Date of Birth: 09-28-1961  Transition [facility] CM/SW Contact:    Alberteen Sam, [place] Phone Number: [phone] 04/01/2019, 12:40 PM  Clinical Narrative:                  CSW attempted to call patient's sister however no answer. CSW called patient's relative Johnny who reports patient is his wife's sister. He is in agreement with patient returning to [facility] at discharge, [place] informed that per MD this could potentially occur tomorrow 6/15. Johnny appreciated the call and asks to be updated when patient is ready to discharge back to [facility]. CSW has notified facility of potential discharge tomorrow 6/16. CSW will continue to follow for discharge planning.   Expected Discharge Plan: Skilled Nursing Facility Barriers to Discharge: Continued Medical Work up   Patient Goals and CMS Choice   CMS Medicare.gov Compare Post Acute Care list provided to:: Patient Represenative (must comment)(Johnny (relative)) Choice offered to / list presented to : (Johnny (relative, patient is his wife's sister))  Expected Discharge Plan and Services Expected Discharge Plan: [place] Acute Care Choice: [place] Living arrangements for the past 2 months: Skilled Nursing Facility([facility])                                      Prior Living Arrangements/Services Living arrangements for the past 2 months: Skilled Nursing Facility([facility]) Lives with:: Self Patient language and need for interpreter reviewed:: Yes Do you feel safe going back to the place where you live?: Yes      Need for Family Participation in Patient Care: Yes (Comment) Care giver support system in place?: Yes (comment)   Criminal Activity/Legal Involvement Pertinent to Current Situation/Hospitalization: No - Comment as needed  Activities of Daily Living Home Assistive  Devices/Equipment: Eyeglasses ADL Screening (condition at time of admission) Patient's cognitive ability adequate to safely complete daily activities?: No Is the patient deaf or have difficulty hearing?: No Does the patient have difficulty seeing, even when wearing glasses/contacts?: Yes Does the patient have difficulty concentrating, remembering, or making decisions?: Yes Patient able to express need for assistance with ADLs?: Yes Does the patient have difficulty dressing or bathing?: Yes Independently performs ADLs?: No Communication: Needs assistance Is this a change from baseline?: Pre-admission baseline Dressing (OT): Needs assistance Is this a change from baseline?: Pre-admission baseline Grooming: Needs assistance Is this a change from baseline?: Pre-admission baseline Feeding: Independent Is this a change from baseline?: Pre-admission baseline Bathing: Needs assistance Is this a change from baseline?: Pre-admission baseline Toileting: Needs assistance Is this a change from baseline?: Pre-admission baseline In/Out Bed: Needs assistance Is this a change from baseline?: Pre-admission baseline Walks in Home: Dependent Does the patient have difficulty walking or climbing stairs?: Yes Weakness of Legs: Both Weakness of Arms/Hands: Both  Permission Sought/Granted Permission sought to share information with : Case Manager, Family Supports, Chartered certified accountant granted to share information with : Yes, Verbal Permission Granted  Share Information with NAME: Leonia Corona  Permission granted to share info w AGENCY: SNFs  Permission granted to share info w Relationship: relative  Permission granted to share info w Contact Information: [phone]  Emotional Assessment Appearance:: Other (Comment Required(unable to assess - remote) Attitude/Demeanor/Rapport: Unable to Assess Affect (typically observed): Unable  to Assess Orientation: : Oriented to Self, Oriented  to Place Alcohol / Substance Use: Not Applicable Psych Involvement: No (comment)  Admission diagnosis:  SOB (shortness of breath) [R06.02] COVID-19 virus infection [U07.1] Patient Active Problem List   Diagnosis Date Noted  . Acute respiratory disease due to COVID-19 virus 03/28/2019  . Chronic respiratory failure with hypoxia (HCC) 09/30/2016  . Chronic acquired lymphedema 09/30/2016  . Moderate intellectual disabilities 07/14/2016  . PVD (peripheral vascular disease) (HCC) 07/14/2016  . Vitamin D deficiency 06/15/2016  . Dry eyes 06/15/2016  . Rheumatoid arthritis, unspecified (HCC) 12/21/2015  . Major depression, chronic 10/26/2015  . Chronic diastolic CHF (congestive heart failure) (HCC) 10/26/2015  . Acute on chronic systolic CHF (congestive heart failure) (HCC) 07/17/2015  . GERD (gastroesophageal reflux disease) 07/17/2015  . HTN (hypertension) 07/17/2015  . Hypothyroid 07/17/2015  . Type 2 diabetes mellitus with peripheral vascular disease (HCC) 07/17/2015  . HLD (hyperlipidemia) 07/17/2015   PCP:  Denny Levy, DO Pharmacy:  No Pharmacies Listed    Social Determinants of Health (SDOH) Interventions    Readmission Risk Interventions No flowsheet data found.

## 2019-04-01 NOTE — Progress Notes (Addendum)
                                  PROGRESS NOTE                                                                                                                                                                                                             Patient Demographics:    Meghan Patterson, is a 57 y.o. female, DOB - 01/11/1961, MRN:[id number]  Outpatient Primary MD for the patient is Fischer, Timothy Lee, DO    LOS - 4  Admit date - 03/28/2019    Chief Complaint  Patient presents with  . Shortness of Breath       Brief Narrative  Meghan Patterson is a 57 y.o. female with medical history significant of RA; intellectual disability; hypothyroidism; HTN; HLD; CHF; COPD on 2 L nasal cannula oxygen; and DM presenting with worsening SOB associated with COVID infection, she was found to be profoundly hypoxic requiring oxygen in the ER and admitted for further care for COVID-19 pneumonitis related acute hypoxic respiratory failure.   Subjective:   Patient in bed, appears comfortable, denies any headache, no fever, no chest pain or pressure, no shortness of breath , no abdominal pain. No focal weakness.    Assessment  & Plan :     1. Acute Hypoxic Resp. Failure due to Acute Covid 19 Viral Illness induced Pneumonitis during the ongoing 2020 Covid 19 Pandemic - she was profoundly hypoxic upon admission, inflammatory markers were borderline elevated, she was promptly started on IV steroids along with REMDESVIR.  Clinically much improved continue and complete IV REMDESIVIR course, continue to encourage activity use flutter valve and I-S, sit up in chair and daytime.  She uses 2 L nasal cannula oxygen at baseline.  COVID-19 Labs  Recent Labs    03/30/19 0500 03/30/19 1640 03/31/19 0300  DDIMER 0.80* 0.91* 0.70*  FERRITIN 75  --  78  CRP 1.1*  --  0.8    No results found for: SARSCOV2NAA   Hepatic Function Latest Ref Rng & Units 03/31/2019 03/30/2019 03/29/2019   Total Protein 6.5 - 8.1 g/dL 6.4(L) 6.0(L) 6.5  Albumin 3.5 - 5.0 g/dL 2.9(L) 2.7(L) 2.9(L)  AST 15 - 41 U/L 13(L) 16 19  ALT 0 - 44 U/L 12 10 12  Alk Phosphatase 38 - 126 U/L 64 63 58  Total Bilirubin 0.3 - 1.2 mg/dL 0.2(L) 0.2(L) 0.3          Component Value Date/Time   BNP 504.7 (H) 03/28/2019 0214      2.  Underlying COPD.  Minimal to no wheezing.  She is on 2 L nasal cannula at SNF.  Continue supportive care.  3.  Chronic diastolic CHF with EF 96% in October 2016.  She is compensated continue home dose beta-blocker and diuretic.  4.  Mildly elevated flat trend troponin upon admission.  Due to demand mismatch caused by profound hypoxia, no chest pain, EKG unchanged, trend was flat not an ACS pattern, continue combination of statin and beta-blocker, low-dose aspirin added for secondary prevention.  5. Hypothyroidism.  Home dose Synthroid.  Stable TSH.  6.  Chronic pain.  On Duragesic patch continue.  7.  Dyslipidemia.  On statin.  8.  Essential hypertension.  For now continue beta-blocker and hold ARB due to hyperkalemia.  Monitor.  9.  Intellectual disability.  SNF upon discharge.  Sister updated.  10.  Hyperkalemia.  Stopped ARB,resolved after Kayexalate, IVF & Lasix.  11.  DM type II.  In reasonable control.  Continue moderate SSI along with low-dose Lantus as she is on steroids for now.  Lab Results  Component Value Date   HGBA1C 5.9 (H) 03/28/2019   CBG (last 3)  Recent Labs    03/31/19 1149 03/31/19 1620 04/01/19 0747  GLUCAP 185* 233* 76       Condition - Extremely Guarded  Family Communication  :  Sister on 03/29/19 & 03/31/19  Code Status :  DNR  Diet :   Diet Order            Diet Carb Modified Fluid consistency: Thin; Room service appropriate? Yes  Diet effective now               Disposition Plan  :  SNF on 04/02/19  Consults  :  None  Procedures  :  None  PUD Prophylaxis :  PPI  DVT Prophylaxis  :  Lovenox   Lab Results   Component Value Date   PLT 286 03/31/2019    Inpatient Medications  Scheduled Meds: . acidophilus  1 capsule Oral Daily  . aspirin  81 mg Oral Daily  . atorvastatin  80 mg Oral QHS  . bisacodyl  10 mg Oral Daily  . chlorhexidine  15 mL Mouth Rinse BID  . citalopram  10 mg Oral Daily  . cycloSPORINE  1 drop Both Eyes BID  . dicyclomine  20 mg Oral TID AC  . docusate sodium  100 mg Oral BID  . enoxaparin (LOVENOX) injection  50 mg Subcutaneous BID  . fentaNYL  1 patch Transdermal Q72H  . fluconazole  100 mg Oral Daily  . Gerhardt's butt cream   Topical TID  . insulin aspart  0-15 Units Subcutaneous TID WC  . insulin aspart  0-5 Units Subcutaneous QHS  . insulin glargine  15 Units Subcutaneous Daily  . levothyroxine  150 mcg Oral QAC breakfast  . mouth rinse  15 mL Mouth Rinse q12n4p  . Melatonin  5 mg Oral QHS  . methylPREDNISolone (SOLU-MEDROL) injection  40 mg Intravenous Daily  . metoprolol succinate  25 mg Oral Daily  . mometasone-formoterol  2 puff Inhalation BID  . pantoprazole  40 mg Oral QODAY  . traZODone  50 mg Oral QHS   Continuous Infusions: . remdesivir 100 mg in NS 250 mL 100 mg (03/31/19 1639)   PRN Meds:.acetaminophen, albuterol, [DISCONTINUED] ondansetron **OR** ondansetron (ZOFRAN) IV, polyethylene glycol  Antibiotics  :  Anti-infectives (From admission, onward)   Start     Dose/Rate Route Frequency Ordered Stop   03/29/19 1530  remdesivir 100 mg in sodium chloride 0.9 % 230 mL IVPB     100 mg 500 mL/hr over 30 Minutes Intravenous Every 24 hours 03/28/19 1411 04/02/19 1529   03/28/19 1800  fluconazole (DIFLUCAN) tablet 100 mg     100 mg Oral Daily 03/28/19 1720     03/28/19 1530  remdesivir 200 mg in sodium chloride 0.9 % 210 mL IVPB     200 mg 500 mL/hr over 30 Minutes Intravenous Once 03/28/19 1411 03/29/19 0846       Time Spent in minutes  30   Prashant Singh M.D on 04/01/2019 at 8:48 AM  To page go to [email or web address] - password [email or web address]   [facility] -  Office  [phone]    See all Orders from today for further details    Objective:   Vitals:   04/01/19 0300 04/01/19 0400 04/01/19 0445 04/01/19 0747  BP:      Pulse: 70  70   Resp:   18   Temp:  98.8 F (37.1 C) 98.4 F (36.9 C) 98.3 F (36.8 C)  TempSrc:  Oral Oral Oral  SpO2: 100%  99%   Weight:      Height:        Wt Readings from Last 3 Encounters:  03/28/19 93.3 kg  02/28/17 124.3 kg  01/20/17 124.5 kg     Intake/Output Summary (Last 24 hours) at 04/01/2019 0848 Last data filed at 03/31/2019 1800 Gross per 24 hour  Intake 850 ml  Output 1225 ml  Net -375 ml     Physical Exam  Awake Alert, Oriented X 3, No new F.N deficits, Normal affect [place].AT,PERRAL Supple Neck,No JVD, No cervical lymphadenopathy appriciated.  Symmetrical Chest wall movement, Good air movement bilaterally, CTAB RRR,No Gallops, Rubs or new Murmurs, No Parasternal Heave +ve B.Sounds, Abd Soft, No tenderness, No organomegaly appriciated, No rebound - guarding or rigidity. No Cyanosis, Clubbing or edema, Chronic left wrist splint    Data Review:    CBC Recent Labs  Lab 03/27/19 1550 03/28/19 0214 03/29/19 0400 03/30/19 0500 03/31/19 0300  WBC 8.5 10.1 4.1 4.6 6.7  HGB 11.7* 11.3* 10.9* 10.2* 11.5*  HCT 42.8 40.3 39.4 37.0 42.9  PLT 231 246 266 242 286  MCV 97.1 95.5 95.6 95.1 98.8  MCH 26.5 26.8 26.5 26.2 26.5  MCHC 27.3* 28.0* 27.7* 27.6* 26.8*  RDW 13.6 13.9 14.1 14.0 14.7  LYMPHSABS 0.9  --  0.5* 0.6* 0.7  MONOABS 0.6  --  0.1 0.2 0.4  EOSABS 0.0  --  0.0 0.0 0.0  BASOSABS 0.0  --  0.0 0.0 0.0    Chemistries  Recent Labs  Lab 03/28/19 0214 03/29/19 0400 03/30/19 0500 03/31/19 0300 04/01/19 0501  NA 142 143 142 144 142  K 4.2 4.7 5.5* 5.3* 4.3  CL 89* 87* 87* 85* 77*  CO2 38* 45* 42* 48* >50*  GLUCOSE 160* 204* 185* 271* 78  BUN 18 23* 31* 34* 33*  CREATININE 0.68 0.43* 0.51 0.58 0.46  CALCIUM 8.4* 8.4* 8.4* 8.8* 8.5*  MG  --  1.5*  1.8 1.8  --   AST 27 19 16 13*  --   ALT 8 12 10 12  --   ALKPHOS 53 58 63 64  --   BILITOT 0.8 0.3 0.2* 0.2*  --    ------------------------------------------------------------------------------------------------------------------ No results for input(s):   CHOL, HDL, LDLCALC, TRIG, CHOLHDL, LDLDIRECT in the last 72 hours.  Lab Results  Component Value Date   HGBA1C 5.9 (H) 03/28/2019   ------------------------------------------------------------------------------------------------------------------ No results for input(s): TSH, T4TOTAL, T3FREE, THYROIDAB in the last 72 hours.  Invalid input(s): FREET3  Cardiac Enzymes Recent Labs  Lab 03/28/19 0214 03/28/19 0717  TROPONINI 0.19* 0.20*   ------------------------------------------------------------------------------------------------------------------    Component Value Date/Time   BNP 504.7 (H) 03/28/2019 0214    Micro Results Recent Results (from the past 240 hour(s))  MRSA PCR Screening     Status: None   Collection Time: 03/28/19  4:20 PM   Specimen: Nasal Mucosa; Nasopharyngeal  Result Value Ref Range Status   MRSA by PCR NEGATIVE NEGATIVE Final    Comment:        The GeneXpert MRSA Assay (FDA approved for NASAL specimens only), is one component of a comprehensive MRSA colonization surveillance program. It is not intended to diagnose MRSA infection nor to guide or monitor treatment for MRSA infections. Performed at [facility], [place] [address], [place], [place] [address]     Radiology Reports Dg Chest Port 1 View  Result Date: 03/28/2019 CLINICAL DATA:  58 year old patient with fever cough and shortness of breath. EXAM: PORTABLE CHEST ONE VIEW COMPARISON:  03/27/2019. FINDINGS: Semi upright AP portable view at 0207 hours. Stable lung volumes and mediastinal contours. Stable patchy opacity at the lung bases with no pneumothorax, pulmonary edema or definite effusion. Negative tracheal air  column. Nonobstructed visible bowel gas pattern. Stable visible osseous structures. IMPRESSION: Unchanged portable appearance of the chest since yesterday. Electronically Signed   By: Genevie Ann M.D.   On: 03/28/2019 03:51   Dg Chest Port 1 View  Result Date: 03/27/2019 CLINICAL DATA:  Dyspnea EXAM: PORTABLE CHEST 1 VIEW COMPARISON:  07/17/2015 chest radiograph. FINDINGS: Low lung volumes. Stable cardiomediastinal silhouette with normal heart size. No pneumothorax. Probable small bilateral pleural effusions. Patchy bibasilar lung opacities. IMPRESSION: Low lung volumes with patchy bibasilar lung opacities, which could represent pneumonia, aspiration and/or atelectasis. Probable small bilateral pleural effusions. Electronically Signed   By: Ilona Sorrel M.D.   On: 03/27/2019 14:20

## 2019-04-02 LAB — BASIC METABOLIC PANEL
Anion gap: 13 (ref 5–15)
BUN: 24 mg/dL — ABNORMAL HIGH (ref 6–20)
CO2: 49 mmol/L — ABNORMAL HIGH (ref 22–32)
Calcium: 8.8 mg/dL — ABNORMAL LOW (ref 8.9–10.3)
Chloride: 80 mmol/L — ABNORMAL LOW (ref 98–111)
Creatinine, Ser: 0.39 mg/dL — ABNORMAL LOW (ref 0.44–1.00)
GFR calc Af Amer: >60 mL/min (ref >60)
GFR calc non Af Amer: >60 mL/min (ref >60)
Glucose, Bld: 86 mg/dL (ref 70–99)
Potassium: 4 mmol/L (ref 3.5–5.1)
Sodium: 142 mmol/L (ref 135–145)

## 2019-04-02 LAB — GLUCOSE, CAPILLARY
Glucose-Capillary: 253 mg/dL — ABNORMAL HIGH (ref 70–99)
Glucose-Capillary: 101 mg/dL — ABNORMAL HIGH (ref 70–99)
Glucose-Capillary: 162 mg/dL — ABNORMAL HIGH (ref 70–99)

## 2019-04-02 MED ORDER — PREDNISONE 5 MG PO TABS: ORAL_TABLET | ORAL | 0 refills | Status: AC

## 2019-04-02 MED ORDER — FENTANYL 12 MCG/HR TD PT72: 1.0000 | MEDICATED_PATCH | TRANSDERMAL | 0 refills | Status: AC

## 2019-04-02 MED ORDER — METHYLPREDNISOLONE SODIUM SUCC 40 MG IJ SOLR
30.0000 mg | Freq: Every day | INTRAMUSCULAR | Status: DC
  Administered 2019-04-02: 30 mg via INTRAVENOUS
  Filled 2019-04-02: qty 1

## 2019-04-02 MED ORDER — ALUM & MAG HYDROXIDE-SIMETH 200-200-20 MG/5ML PO SUSP
15.0000 mL | ORAL | Status: DC | PRN
  Administered 2019-04-02: 15 mL via ORAL
  Filled 2019-04-02: qty 30

## 2019-04-02 MED ORDER — INSULIN ASPART 100 UNIT/ML ~~LOC~~ SOLN: SUBCUTANEOUS | 0 refills | Status: AC

## 2019-04-02 NOTE — Progress Notes (Signed)
Tried POA - sister Inez Catalina, no answer. Left message of attempt of call

## 2019-04-02 NOTE — TOC Transition Note (Signed)
Transition of Care [facility]) - CM/SW Discharge Note   Patient Details  Name: Meghan Patterson MRN: [id number] Date of Birth: 09/10/1961  [facility] CM/SW Contact:  Alberteen Sam, [place] Phone Number: [phone] 04/02/2019, 10:07 AM   Clinical Narrative:     Patient will DC to: [facility] Anticipated DC date: 04/02/19 Family notified: Charlotte Crumb Transport by: Corey Harold  Per MD patient ready for DC to [facility] . RN, patient, patient's family, and facility notified of DC. Discharge Summary sent to facility. RN given number for report [phone] . DC packet on chart. Ambulance transport will be requested for patient.  CSW signing off.  [place], [place]   Final next level of care: Skilled Nursing Facility Barriers to Discharge: No Barriers Identified   Patient Goals and CMS Choice   CMS Medicare.gov Compare Post Acute Care list provided to:: Patient Represenative (must comment)(Johnny) Choice offered to / list presented to : (relative - Johnny)  Discharge Placement PASRR number recieved: 03/31/19            Patient [facility] bed at: [facility] Patient to be transferred to facility by: [place] Name of family member notified: Johnny Patient and family notified of of transfer: 04/02/19  Discharge Plan and Services     Post Acute Care Choice: [facility]                               Social Determinants of Health (SDOH) Interventions     Readmission Risk Interventions No flowsheet data found.

## 2019-04-02 NOTE — Discharge Summary (Signed)
AVERI KILTY WGN:[id number] DOB: 01-30-1961 DOA: 03/28/2019  PCP: Denny Levy, DO  Admit date: 03/28/2019  Discharge date: 04/02/2019  Admitted From: SNF   Disposition:  SNF   Recommendations for Outpatient Follow-up:   Follow up with PCP in 1-2 weeks  PCP Please obtain BMP/CBC, 2 view CXR in 1week,  (see Discharge instructions)   PCP Please follow up on the following pending results:    Home Health: None   Equipment/Devices: None  Consultations: None Discharge Condition: Stable   CODE STATUS: Full   Diet Recommendation: Heart Healthy Low Carb     Chief Complaint  Patient presents with   Shortness of Breath     Brief history of present illness from the day of admission and additional interim summary    Jeny P Sandfordis a 58 y.o.femalewith medical history significant ofRA; intellectual disability; hypothyroidism; HTN; HLD; CHF;COPD on 2 L nasal cannula oxygen;and DM presenting with worsening SOB associated with COVID infection, she was found to be profoundly hypoxic requiring oxygen in the ER and admitted for further care for COVID-19 pneumonitis related acute hypoxic respiratory failure.                                                                 Hospital Course    1. 1. Acute Hypoxic Resp. Failure due to Acute Covid 19 Viral Illness during the ongoing 2020 Covid 19 Pandemic - she was profoundly hypoxic upon admission, inflammatory markers were borderline elevated, she was promptly started on IV steroids along with REMDESVIR.  Clinically much improved and now at baseline with 2-3lit [place] o3, has completed her  IV REMDESIVIR course, continue to encourage activity use flutter valve and I-S, sit up in chair and daytime. DC to SNF on short course of PO steroid taper.   2.  Underlying  COPD.  Minimal to no wheezing.  She is on 2 L nasal cannula at SNF.  Continue supportive care.  3.  Chronic diastolic CHF with EF 60% in October 2016.  She is compensated continue home dose beta-blocker and diuretic.  4.  Mildly elevated flat trend troponin upon admission.  Due to demand mismatch caused by profound hypoxia, no chest pain, EKG unchanged, trend was flat not an ACS pattern, continue combination of statin and beta-blocker, low-dose aspirin added for secondary prevention.  5. Hypothyroidism.  Home dose Synthroid.  Stable TSH.  6.  Chronic pain.  On Duragesic patch continue.  7.  Dyslipidemia.  On statin.  8.  Essential hypertension.  For now continue beta-blocker and hold ARB due to hyperkalemia.  Monitor.  9.  Intellectual disability.  SNF upon discharge.  Sister updated.  10.  Hyperkalemia.  Stop ARB, Kayexalate, IV fluid and Lasix.  Repeat in the morning.  11.  DM type II.  In reasonable control. Since on steroids will add ISS QAC to SNF regimen, check CBGs QAC-HS.   Discharge diagnosis     Principal Problem:   Acute respiratory disease due to COVID-19 virus Active Problems:   HTN (hypertension)   Hypothyroid   Type 2 diabetes mellitus with peripheral vascular disease (HCC)   HLD (hyperlipidemia)   Chronic diastolic CHF (congestive heart failure) (HCC)   Moderate intellectual disabilities   Chronic respiratory failure with hypoxia (HCC)    Discharge instructions    Discharge Instructions    Diet - low sodium heart healthy   Complete by: As directed    Discharge instructions   Complete by: As directed    Follow with Primary MD Fischer, Suann Larryimothy Lee, DO in 7 days   Get CBC, CMP, 2 view Chest X ray -  checked next visit within 1 week by Primary MD or SNF MD   Activity: As tolerated with Full fall precautions use walker/cane & assistance as needed  Disposition SNF  Diet: Heart Healthy Low Carb with feeding assistance and aspiration  precautions.  CBGs QAC-HS  Special Instructions: If you have smoked or chewed Tobacco  in the last 2 yrs please stop smoking, stop any regular Alcohol  and or any Recreational drug use.  On your next visit with your primary care physician please Get Medicines reviewed and adjusted.  Please request your Prim.MD to go over all Hospital Tests and Procedure/Radiological results at the follow up, please get all Hospital records sent to your Prim MD by signing hospital release before you go home.  If you experience worsening of your admission symptoms, develop shortness of breath, life threatening emergency, suicidal or homicidal thoughts you must seek medical attention immediately by calling 911 or calling your MD immediately  if symptoms less severe.  You Must read complete instructions/literature along with all the possible adverse reactions/side effects for all the Medicines you take and that have been prescribed to you. Take any new Medicines after you have completely understood and accpet all the possible adverse reactions/side effects.   Increase activity slowly   Complete by: As directed       Discharge Medications   Allergies as of 04/02/2019      Reactions   Bee Venom Other (See Comments)   On MAR      Medication List    TAKE these medications   acetaminophen 325 MG tablet Commonly known as: TYLENOL Take 650 mg by mouth every 8 (eight) hours as needed for fever or headache (or muscle pain).   albuterol 108 (90 Base) MCG/ACT inhaler Commonly known as: VENTOLIN HFA Inhale 2 puffs into the lungs every 4 (four) hours as needed for wheezing or shortness of breath.   atorvastatin 80 MG tablet Commonly known as: LIPITOR Take 80 mg by mouth at bedtime.   Biofreeze 4 % Gel Generic drug: Menthol (Topical Analgesic) Apply 1 application topically 3 (three) times daily. To right shoulder for pain   citalopram 10 MG tablet Commonly known as: CELEXA Take 10 mg by mouth daily.    cycloSPORINE 0.05 % ophthalmic emulsion Commonly known as: RESTASIS Place 1 drop into both eyes 2 (two) times daily.   dicyclomine 20 MG tablet Commonly known as: BENTYL Take 20 mg by mouth 3 (three) times daily before meals.   fentaNYL 12 MCG/HR Commonly known as: DURAGESIC Place 1 patch onto the skin every 3 (three) days.   Fluticasone-Salmeterol 250-50 MCG/DOSE Aepb Commonly known as: ADVAIR Inhale 1 puff  into the lungs 2 (two) times daily.   furosemide 20 MG tablet Commonly known as: LASIX Take 1 tablet (20 mg total) by mouth daily.   insulin aspart 100 UNIT/ML injection Commonly known as: NovoLOG Before each meal 3 times a day , 140-199 - 2 units, 200-250 - 4 units, 251-299 - 6 units,  300-349 - 8 units,  350 or above 10 units. Insulin PEN if approved, provide syringes and needles if needed. Can switch to any other cheaper alternative.   ipratropium-albuterol 0.5-2.5 (3) MG/3ML Soln Commonly known as: DUONEB Take 3 mLs by nebulization every 6 (six) hours as needed (for wheezing).   levothyroxine 150 MCG tablet Commonly known as: SYNTHROID Take 150 mcg by mouth daily before breakfast.   losartan 50 MG tablet Commonly known as: COZAAR Take 50 mg by mouth daily.   Melatonin 5 MG Tabs Take 5 mg by mouth at bedtime.   metFORMIN 500 MG 24 hr tablet Commonly known as: GLUCOPHAGE-XR Take 500 mg by mouth daily with breakfast.   metoprolol succinate 25 MG 24 hr tablet Commonly known as: TOPROL-XL Take 25 mg by mouth daily.   OXYGEN Inhale 2 L into the lungs continuous.   pantoprazole 40 MG tablet Commonly known as: PROTONIX Take 40 mg by mouth every other day.   polyethylene glycol 17 g packet Commonly known as: MIRALAX / GLYCOLAX Take 17 g by mouth daily as needed for mild constipation.   potassium chloride SA 20 MEQ tablet Commonly known as: K-DUR Take 20 mEq by mouth daily.   predniSONE 5 MG tablet Commonly known as: DELTASONE Label  & dispense  according to the schedule below. Take 6 Pills PO for 3 days, 4 Pills PO for 3 days, 2 Pills PO for 3 days, 1 Pills PO for 3 days, 1/2 Pill  PO for 3 days then STOP.   saccharomyces boulardii 250 MG capsule Commonly known as: FLORASTOR Take 250 mg by mouth at bedtime.   sodium chloride 0.65 % nasal spray Commonly known as: OCEAN Place 1 spray into the nose 3 (three) times daily.   traZODone 50 MG tablet Commonly known as: DESYREL Take 50 mg by mouth at bedtime.   vitamin B-12 1000 MCG tablet Commonly known as: CYANOCOBALAMIN Take 500 mcg by mouth daily.   Vitamin D (Ergocalciferol) 1.25 MG (50000 UT) Caps capsule Commonly known as: DRISDOL Take 50,000 Units by mouth every 30 (thirty) days.   zinc oxide 11.3 % Crea cream Commonly known as: BALMEX Apply 1 application topically as needed (to buttocks).        Contact information for follow-up providers    Fischer, Suann Larry, DO. Schedule an appointment as soon as possible for a visit in 1 week(s).   Specialty: Family Medicine Contact information: [address] [place] [address] [phone]            Contact information for after-discharge care    Destination    [facility]erred [facility] .   Service: Skilled Nursing Contact information: [address] [place] [address] [phone]                  Major procedures and Radiology Reports - PLEASE review detailed and final reports thoroughly  -        Dg Chest Port 1 View  Result Date: 03/28/2019 CLINICAL DATA:  58 year old patient with fever cough and shortness of breath. EXAM: PORTABLE CHEST ONE VIEW COMPARISON:  03/27/2019. FINDINGS: Semi upright AP portable  view at 0207 hours. Stable lung volumes and mediastinal contours. Stable patchy opacity at the lung bases with no pneumothorax, pulmonary edema or definite effusion. Negative tracheal air column. Nonobstructed visible bowel gas pattern. Stable visible osseous  structures. IMPRESSION: Unchanged portable appearance of the chest since yesterday. Electronically Signed   By: Genevie Ann M.D.   On: 03/28/2019 03:51   Dg Chest Port 1 View  Result Date: 03/27/2019 CLINICAL DATA:  Dyspnea EXAM: PORTABLE CHEST 1 VIEW COMPARISON:  07/17/2015 chest radiograph. FINDINGS: Low lung volumes. Stable cardiomediastinal silhouette with normal heart size. No pneumothorax. Probable small bilateral pleural effusions. Patchy bibasilar lung opacities. IMPRESSION: Low lung volumes with patchy bibasilar lung opacities, which could represent pneumonia, aspiration and/or atelectasis. Probable small bilateral pleural effusions. Electronically Signed   By: Ilona Sorrel M.D.   On: 03/27/2019 14:20    Micro Results    Recent Results (from the past 240 hour(s))  MRSA PCR Screening     Status: None   Collection Time: 03/28/19  4:20 PM   Specimen: Nasal Mucosa; Nasopharyngeal  Result Value Ref Range Status   MRSA by PCR NEGATIVE NEGATIVE Final    Comment:        The GeneXpert MRSA Assay (FDA approved for NASAL specimens only), is one component of a comprehensive MRSA colonization surveillance program. It is not intended to diagnose MRSA infection nor to guide or monitor treatment for MRSA infections. Performed at [facility], [place] [address] [place], [place] [address]     Today   Subjective    Nature Kueker today has no headache,no chest abdominal pain,no new weakness tingling or numbness, feels much better wants to go home today.     Objective   Blood pressure (!) 143/70, pulse 86, temperature 97.9 F (36.6 C), temperature source Oral, resp. rate 18, height 5\' 2"  (1.575 m), weight 93.3 kg, SpO2 92 %.   Intake/Output Summary (Last 24 hours) at 04/02/2019 0959 Last data filed at 04/02/2019 0914 Gross per 24 hour  Intake 300 ml  Output 700 ml  Net -400 ml    Exam  Awake Alert, Oriented x 3, No new F.N deficits,   [place].AT,PERRAL Supple Neck,No  JVD, No cervical lymphadenopathy appriciated.  Symmetrical Chest wall movement, Good air movement bilaterally, CTAB RRR,No Gallops,Rubs or new Murmurs, No Parasternal Heave +ve B.Sounds, Abd Soft, Non tender, No organomegaly appriciated, No rebound -guarding or rigidity. No Cyanosis, Clubbing or edema, No new Rash or bruise   Data Review   CBC w Diff:  Lab Results  Component Value Date   WBC 6.7 03/31/2019   HGB 11.5 (L) 03/31/2019   HGB 13.5 04/06/2014   HCT 42.9 03/31/2019   HCT 42.8 04/06/2014   PLT 286 03/31/2019   PLT 202 04/06/2014   LYMPHOPCT 10 03/31/2019   LYMPHOPCT 36.1 04/06/2014   MONOPCT 6 03/31/2019   MONOPCT 11.8 04/06/2014   EOSPCT 0 03/31/2019   EOSPCT 5.9 04/06/2014   BASOPCT 0 03/31/2019   BASOPCT 0.6 04/06/2014    CMP:  Lab Results  Component Value Date   NA 142 04/02/2019   NA 138 08/26/2016   NA 137 04/06/2014   K 4.0 04/02/2019   K 4.1 04/06/2014   CL 80 (L) 04/02/2019   CL 98 04/06/2014   CO2 49 (H) 04/02/2019   CO2 33 (H) 04/06/2014   BUN 24 (H) 04/02/2019   BUN 17 08/26/2016   BUN 25 (H) 04/06/2014   CREATININE 0.39 (L) 04/02/2019  CREATININE 0.56 (L) 04/06/2014   GLU 209 08/26/2016   PROT 6.4 (L) 03/31/2019   PROT 6.3 (L) 04/06/2014   ALBUMIN 2.9 (L) 03/31/2019   ALBUMIN 3.2 (L) 04/06/2014   BILITOT 0.2 (L) 03/31/2019   BILITOT 0.5 04/06/2014   ALKPHOS 64 03/31/2019   ALKPHOS 65 04/06/2014   AST 13 (L) 03/31/2019   AST 37 04/06/2014   ALT 12 03/31/2019   ALT 34 04/06/2014  . COVID-19 Labs  Recent Labs    03/30/19 1640 03/31/19 0300  DDIMER 0.91* 0.70*  FERRITIN  --  78  CRP  --  0.8    No results found for: SARSCOV2NAA  Lab Results  Component Value Date   HGBA1C 5.9 (H) 03/28/2019     Total Time in preparing paper work, data evaluation and todays exam - 35 minutes  Susa RaringPrashant Morganna Styles M.D on 04/02/2019 at 9:59 AM  [facility]   Office  [phone]

## 2019-04-02 NOTE — Plan of Care (Signed)
  Problem: Education: Goal: Knowledge of risk factors and measures for prevention of condition will improve Outcome: Completed/Met   Problem: Coping: Goal: Psychosocial and spiritual needs will be supported Outcome: Completed/Met   Problem: Respiratory: Goal: Will maintain a patent airway Outcome: Completed/Met Goal: Complications related to the disease process, condition or treatment will be avoided or minimized Outcome: Completed/Met   Problem: Education: Goal: Knowledge of General Education information will improve Description: Including pain rating scale, medication(s)/side effects and non-pharmacologic comfort measures Outcome: Completed/Met   Problem: Health Behavior/Discharge Planning: Goal: Ability to manage health-related needs will improve Outcome: Completed/Met   Problem: Clinical Measurements: Goal: Ability to maintain clinical measurements within normal limits will improve Outcome: Completed/Met Goal: Will remain free from infection Outcome: Completed/Met Goal: Diagnostic test results will improve Outcome: Completed/Met Goal: Respiratory complications will improve Outcome: Completed/Met Goal: Cardiovascular complication will be avoided Outcome: Completed/Met   Problem: Activity: Goal: Risk for activity intolerance will decrease Outcome: Completed/Met   Problem: Nutrition: Goal: Adequate nutrition will be maintained Outcome: Completed/Met   Problem: Coping: Goal: Level of anxiety will decrease Outcome: Completed/Met   Problem: Elimination: Goal: Will not experience complications related to bowel motility Outcome: Completed/Met Goal: Will not experience complications related to urinary retention Outcome: Completed/Met   Problem: Pain Managment: Goal: General experience of comfort will improve Outcome: Completed/Met   Problem: Safety: Goal: Ability to remain free from injury will improve Outcome: Completed/Met   Problem: Skin Integrity: Goal:  Risk for impaired skin integrity will decrease Outcome: Completed/Met

## 2019-04-02 NOTE — Care Management Important Message (Signed)
Important Message  Patient Details  Name: Meghan Patterson MRN: [id number] Date of Birth: [birth year]   Medicare Important Message Given:  Yes  IM mailed on 04/02/2019 by CMA , to emergency contact on file  [name] [address] 04/02/2019, 4:12 PM

## 2019-04-02 NOTE — Discharge Instructions (Signed)
Follow with Primary MD Fischer, Karn Pickler, DO in 7 days   Get CBC, CMP, 2 view Chest X ray -  checked next visit within 1 week by Primary MD or SNF MD   Activity: As tolerated with Full fall precautions use walker/cane & assistance as needed  Disposition SNF  Diet: Heart Healthy Low Carb with feeding assistance and aspiration precautions.  CBGs QAC-HS  Special Instructions: If you have smoked or chewed Tobacco  in the last 2 yrs please stop smoking, stop any regular Alcohol  and or any Recreational drug use.  On your next visit with your primary care physician please Get Medicines reviewed and adjusted.  Please request your Prim.MD to go over all Hospital Tests and Procedure/Radiological results at the follow up, please get all Hospital records sent to your Prim MD by signing hospital release before you go home.  If you experience worsening of your admission symptoms, develop shortness of breath, life threatening emergency, suicidal or homicidal thoughts you must seek medical attention immediately by calling 911 or calling your MD immediately  if symptoms less severe.  You Must read complete instructions/literature along with all the possible adverse reactions/side effects for all the Medicines you take and that have been prescribed to you. Take any new Medicines after you have completely understood and accpet all the possible adverse reactions/side effects.

## 2019-04-02 NOTE — Progress Notes (Signed)
Discharge report given to the [facility] for Pt's return to facility.  Pt given instructions on return to home.  Education provided on wearing mask when leaving room, and medication received today, and Plan of care. Pt left today with [facility] back to [facility]

## 2019-06-01 ENCOUNTER — Emergency Department (HOSPITAL_COMMUNITY): Payer: Medicare Other

## 2019-06-01 ENCOUNTER — Emergency Department (HOSPITAL_COMMUNITY)
Admission: EM | Admit: 2019-06-01 | Discharge: 2019-06-18 | Disposition: E | Payer: Medicare Other | Attending: Emergency Medicine | Admitting: Emergency Medicine

## 2019-06-01 DIAGNOSIS — E039 Hypothyroidism, unspecified: Secondary | ICD-10-CM | POA: Diagnosis not present

## 2019-06-01 DIAGNOSIS — Z794 Long term (current) use of insulin: Secondary | ICD-10-CM | POA: Diagnosis not present

## 2019-06-01 DIAGNOSIS — J90 Pleural effusion, not elsewhere classified: Secondary | ICD-10-CM | POA: Diagnosis not present

## 2019-06-01 DIAGNOSIS — M069 Rheumatoid arthritis, unspecified: Secondary | ICD-10-CM | POA: Insufficient documentation

## 2019-06-01 DIAGNOSIS — I5042 Chronic combined systolic (congestive) and diastolic (congestive) heart failure: Secondary | ICD-10-CM | POA: Diagnosis not present

## 2019-06-01 DIAGNOSIS — K219 Gastro-esophageal reflux disease without esophagitis: Secondary | ICD-10-CM | POA: Diagnosis not present

## 2019-06-01 DIAGNOSIS — E785 Hyperlipidemia, unspecified: Secondary | ICD-10-CM | POA: Insufficient documentation

## 2019-06-01 DIAGNOSIS — E1151 Type 2 diabetes mellitus with diabetic peripheral angiopathy without gangrene: Secondary | ICD-10-CM | POA: Insufficient documentation

## 2019-06-01 DIAGNOSIS — R4182 Altered mental status, unspecified: Secondary | ICD-10-CM | POA: Insufficient documentation

## 2019-06-01 DIAGNOSIS — Z7951 Long term (current) use of inhaled steroids: Secondary | ICD-10-CM | POA: Insufficient documentation

## 2019-06-01 DIAGNOSIS — Z79899 Other long term (current) drug therapy: Secondary | ICD-10-CM | POA: Diagnosis not present

## 2019-06-01 DIAGNOSIS — I469 Cardiac arrest, cause unspecified: Secondary | ICD-10-CM

## 2019-06-01 DIAGNOSIS — Z4682 Encounter for fitting and adjustment of non-vascular catheter: Secondary | ICD-10-CM | POA: Insufficient documentation

## 2019-06-01 DIAGNOSIS — I110 Hypertensive heart disease with heart failure: Secondary | ICD-10-CM | POA: Diagnosis not present

## 2019-06-01 DIAGNOSIS — Z20828 Contact with and (suspected) exposure to other viral communicable diseases: Secondary | ICD-10-CM | POA: Insufficient documentation

## 2019-06-01 DIAGNOSIS — Z8249 Family history of ischemic heart disease and other diseases of the circulatory system: Secondary | ICD-10-CM | POA: Diagnosis not present

## 2019-06-01 DIAGNOSIS — E559 Vitamin D deficiency, unspecified: Secondary | ICD-10-CM | POA: Diagnosis not present

## 2019-06-01 DIAGNOSIS — F79 Unspecified intellectual disabilities: Secondary | ICD-10-CM | POA: Insufficient documentation

## 2019-06-01 DIAGNOSIS — Z8619 Personal history of other infectious and parasitic diseases: Secondary | ICD-10-CM | POA: Diagnosis not present

## 2019-06-01 DIAGNOSIS — R0603 Acute respiratory distress: Secondary | ICD-10-CM | POA: Diagnosis not present

## 2019-06-01 DIAGNOSIS — E7800 Pure hypercholesterolemia, unspecified: Secondary | ICD-10-CM | POA: Insufficient documentation

## 2019-06-01 LAB — COMPREHENSIVE METABOLIC PANEL
ALT: 138 U/L — ABNORMAL HIGH (ref 0–44)
AST: 209 U/L — ABNORMAL HIGH (ref 15–41)
Albumin: 2.7 g/dL — ABNORMAL LOW (ref 3.5–5.0)
Alkaline Phosphatase: 83 U/L (ref 38–126)
Anion gap: 19 — ABNORMAL HIGH (ref 5–15)
BUN: 14 mg/dL (ref 6–20)
CO2: 41 mmol/L — ABNORMAL HIGH (ref 22–32)
Calcium: 9.1 mg/dL (ref 8.9–10.3)
Chloride: 80 mmol/L — ABNORMAL LOW (ref 98–111)
Creatinine, Ser: 0.78 mg/dL (ref 0.44–1.00)
GFR calc Af Amer: >60 mL/min (ref >60)
GFR calc non Af Amer: >60 mL/min (ref >60)
Glucose, Bld: 300 mg/dL — ABNORMAL HIGH (ref 70–99)
Potassium: 4.8 mmol/L (ref 3.5–5.1)
Sodium: 140 mmol/L (ref 135–145)
Total Bilirubin: 0.6 mg/dL (ref 0.3–1.2)
Total Protein: 5.6 g/dL — ABNORMAL LOW (ref 6.5–8.1)

## 2019-06-01 LAB — CBC WITH DIFFERENTIAL/PLATELET
Abs Immature Granulocytes: 0 10*3/uL (ref 0.00–0.07)
Band Neutrophils: 8 %
Basophils Absolute: 0 10*3/uL (ref 0.0–0.1)
Basophils Relative: 0 %
Eosinophils Absolute: 0.1 10*3/uL (ref 0.0–0.5)
Eosinophils Relative: 1 %
HCT: 38.8 % (ref 36.0–46.0)
Hemoglobin: 11 g/dL — ABNORMAL LOW (ref 12.0–15.0)
Lymphocytes Relative: 34 %
Lymphs Abs: 4.6 10*3/uL — ABNORMAL HIGH (ref 0.7–4.0)
MCH: 29.1 pg (ref 26.0–34.0)
MCHC: 28.4 g/dL — ABNORMAL LOW (ref 30.0–36.0)
MCV: 102.6 fL — ABNORMAL HIGH (ref 80.0–100.0)
Monocytes Absolute: 0.4 10*3/uL (ref 0.1–1.0)
Monocytes Relative: 3 %
Neutro Abs: 8.4 10*3/uL — ABNORMAL HIGH (ref 1.7–7.7)
Neutrophils Relative %: 54 %
Platelets: 188 10*3/uL (ref 150–400)
RBC: 3.78 MIL/uL — ABNORMAL LOW (ref 3.87–5.11)
RDW: 13.6 % (ref 11.5–15.5)
WBC: 13.5 10*3/uL — ABNORMAL HIGH (ref 4.0–10.5)
nRBC: 0 /100 WBC
nRBC: 0.5 % — ABNORMAL HIGH (ref 0.0–0.2)

## 2019-06-01 LAB — D-DIMER, QUANTITATIVE: D-Dimer, Quant: 18.82 ug/mL-FEU — ABNORMAL HIGH (ref 0.00–0.50)

## 2019-06-01 LAB — TROPONIN I (HIGH SENSITIVITY): Troponin I (High Sensitivity): 37 ng/L — ABNORMAL HIGH (ref <18)

## 2019-06-01 LAB — LIPASE, BLOOD: Lipase: 40 U/L (ref 11–51)

## 2019-06-01 LAB — LACTIC ACID, PLASMA: Lactic Acid, Venous: 10.5 mmol/L (ref 0.5–1.9)

## 2019-06-01 LAB — PROTIME-INR
INR: 1 (ref 0.8–1.2)
Prothrombin Time: 13.4 seconds (ref 11.4–15.2)

## 2019-06-01 LAB — SARS CORONAVIRUS 2 BY RT PCR (HOSPITAL ORDER, PERFORMED IN ~~LOC~~ HOSPITAL LAB): SARS Coronavirus 2: NEGATIVE

## 2019-06-01 MED ORDER — GLYCOPYRROLATE 0.2 MG/ML IJ SOLN: 0.2000 mg | INTRAMUSCULAR | Status: DC | PRN

## 2019-06-01 MED ORDER — NOREPINEPHRINE 4 MG/250ML-% IV SOLN: 0.0000 ug/min | INTRAVENOUS | Status: DC

## 2019-06-01 MED ORDER — DIPHENHYDRAMINE HCL 50 MG/ML IJ SOLN: 25.0000 mg | INTRAMUSCULAR | Status: DC | PRN

## 2019-06-01 MED ORDER — ACETAMINOPHEN 650 MG RE SUPP: 650.0000 mg | Freq: Four times a day (QID) | RECTAL | Status: DC | PRN

## 2019-06-01 MED ORDER — ACETAMINOPHEN 325 MG PO TABS: 650.0000 mg | ORAL_TABLET | Freq: Four times a day (QID) | ORAL | Status: DC | PRN

## 2019-06-01 MED ORDER — POLYVINYL ALCOHOL 1.4 % OP SOLN
1.0000 [drp] | Freq: Four times a day (QID) | OPHTHALMIC | Status: DC | PRN
  Filled 2019-06-01: qty 15

## 2019-06-01 MED ORDER — NOREPINEPHRINE BITARTRATE 1 MG/ML IV SOLN
INTRAVENOUS | Status: DC | PRN
  Administered 2019-06-01: 60 ug via INTRAVENOUS
  Administered 2019-06-01 (×2): 10 ug via INTRAVENOUS

## 2019-06-01 MED ORDER — VANCOMYCIN HCL 10 G IV SOLR
1500.0000 mg | Freq: Once | INTRAVENOUS | Status: DC
  Filled 2019-06-01: qty 1500

## 2019-06-01 MED ORDER — HEPARIN (PORCINE) 25000 UT/250ML-% IV SOLN: 1250.0000 [IU]/h | INTRAVENOUS | Status: DC

## 2019-06-01 MED ORDER — HEPARIN BOLUS VIA INFUSION
4000.0000 [IU] | Freq: Once | INTRAVENOUS | Status: DC
  Filled 2019-06-01: qty 4000

## 2019-06-01 MED ORDER — PHENYLEPHRINE HCL (PRESSORS) 10 MG/ML IV SOLN
INTRAVENOUS | Status: DC | PRN
  Administered 2019-06-01: 250 ug via INTRAVENOUS

## 2019-06-01 MED ORDER — SODIUM CHLORIDE 0.9 % IV SOLN: 2.0000 g | Freq: Once | INTRAVENOUS | Status: DC

## 2019-06-01 MED ORDER — MORPHINE 100MG IN NS 100ML (1MG/ML) PREMIX INFUSION: 1.0000 mg/h | INTRAVENOUS | Status: DC

## 2019-06-01 MED ORDER — DEXTROSE 5 % IV SOLN: INTRAVENOUS | Status: DC

## 2019-06-01 MED ORDER — GLYCOPYRROLATE 1 MG PO TABS
1.0000 mg | ORAL_TABLET | ORAL | Status: DC | PRN
  Filled 2019-06-01: qty 1

## 2019-06-18 NOTE — ED Notes (Addendum)
To ED via [facility] from [facility] and rehab-- was there for [place] re[facility] -- has had 2 negative tests recently. Was in hospital in June with [place].  EMS was calle dat 0708- pt was found pulseless and apneic-- EMS arrived at 0718--CPR started- 0730--[facility] returned -- on arrival to ED  - pt is apniec-- -- has [place] airway in place, IO in Left tibia--  Received Epi x 4 NS x 1200cc

## 2019-06-18 NOTE — Progress Notes (Signed)
   06-14-2019 1000  Clinical Encounter Type  Visited With Family;Health care provider  Visit Type Initial;Spiritual support;Psychological support;ED;Death  Referral From Nurse  Spiritual Encounters  Spiritual Needs Emotional;Grief support;Brochure  Stress Factors  Family Stress Factors Loss of control;Loss;Major life changes;Financial concerns   Met w/ family in consult room when dr notifying of death, along w/ RN Santiago Glad.  Supported family's grief and lament (pt's sister and niece).  Yesterday was Dec 28, 2022 anniv of pt's father's death.  Sister had concerns about affording burial, provided list of basic funeral prices and add'l info on keeping down funeral costs from non-profit [email or web address].    Gave name of funeral home desired, b/c pt had burial plot there, to [employer] and provided contact info for sister for [facility].  Accompanied family to see pt 2x (post-death).  [place] Gian[place] resident, [phone]

## 2019-06-18 NOTE — ED Provider Notes (Signed)
[facility] EMERGENCY DEPARTMENT Provider Note   CSN: [id number] Arrival date & time: Jun 25, 2019  7062    History   Chief Complaint Chief Complaint  Patient presents with   Cardiac Arrest    HPI Anesa P Bokelman is a 58 y.o. female.     Level 5 caveat due to altered mental status  The history is provided by the EMS personnel.  Cardiac Arrest Witnessed by:  Not witnessed Incident location:  Nursing home Time since incident:  1 hour Time before ALS initiated:  > 10 minutes Condition upon EMS arrival:  Unresponsive Pulse:  Absent Treatments prior to arrival:  ACLS protocol Airway: king airway. Rhythm on admission to ED:  Normal sinus Risk factors comment:  Patient recently with COVID 19   Past Medical History:  Diagnosis Date   Depressive disorder    Diabetes mellitus without complication (HCC)    FTT (failure to thrive) in adult    GERD (gastroesophageal reflux disease)    Heart failure (HCC)    Hypercholesteremia    Hypertension    Hypothyroid    Hypoxemia    Intellectual disability    Magnesium deficiency    Rheumatoid arthritis (HCC)     Patient Active Problem List   Diagnosis Date Noted   Acute respiratory disease due to COVID-19 virus 03/28/2019   Chronic respiratory failure with hypoxia (HCC) 09/30/2016   Chronic acquired lymphedema 09/30/2016   Moderate intellectual disabilities 07/14/2016   PVD (peripheral vascular disease) (HCC) 07/14/2016   Vitamin D deficiency 06/15/2016   Dry eyes 06/15/2016   Rheumatoid arthritis, unspecified (HCC) 12/21/2015   Major depression, chronic 10/26/2015   Chronic diastolic CHF (congestive heart failure) (HCC) 10/26/2015   Acute on chronic systolic CHF (congestive heart failure) (HCC) 07/17/2015   GERD (gastroesophageal reflux disease) 07/17/2015   HTN (hypertension) 07/17/2015   Hypothyroid 07/17/2015   Type 2 diabetes mellitus with peripheral vascular disease (HCC)  07/17/2015   HLD (hyperlipidemia) 07/17/2015    Past Surgical History:  Procedure Laterality Date   ABDOMINAL HYSTERECTOMY       OB History   No obstetric history on file.      Home Medications    Prior to Admission medications   Medication Sig Start Date End Date Taking? Authorizing Provider  acetaminophen (TYLENOL) 325 MG tablet Take 650 mg by mouth every 8 (eight) hours as needed for fever or headache (or muscle pain).     [provider]  albuterol (VENTOLIN HFA) 108 (90 Base) MCG/ACT inhaler Inhale 2 puffs into the lungs every 4 (four) hours as needed for wheezing or shortness of breath.    [provider]  atorvastatin (LIPITOR) 80 MG tablet Take 80 mg by mouth at bedtime.    [provider]  citalopram (CELEXA) 10 MG tablet Take 10 mg by mouth daily.    [provider]  cycloSPORINE (RESTASIS) 0.05 % ophthalmic emulsion Place 1 drop into both eyes 2 (two) times daily.    [provider]  dicyclomine (BENTYL) 20 MG tablet Take 20 mg by mouth 3 (three) times daily before meals.    [provider]  fentaNYL (DURAGESIC) 12 MCG/HR Place 1 patch onto the skin every 3 (three) days. 04/02/19   Leroy Sea, MD  Fluticasone-Salmeterol (ADVAIR) 250-50 MCG/DOSE AEPB Inhale 1 puff into the lungs 2 (two) times daily.    [provider]  furosemide (LASIX) 20 MG tablet Take 1 tablet (20 mg total) by mouth daily. 07/21/15  Katha HammingKonidena, Snehalatha, MD  insulin aspart (NOVOLOG) 100 UNIT/ML injection Before each meal 3 times a day , 140-199 - 2 units, 200-250 - 4 units, 251-299 - 6 units,  300-349 - 8 units,  350 or above 10 units. Insulin PEN if approved, provide syringes and needles if needed. Can switch to any other cheaper alternative. 04/02/19   Leroy SeaSingh, Prashant K, MD  ipratropium-albuterol (DUONEB) 0.5-2.5 (3) MG/3ML SOLN Take 3 mLs by nebulization every 6 (six) hours as needed (for wheezing).     [provider]    levothyroxine (SYNTHROID, LEVOTHROID) 150 MCG tablet Take 150 mcg by mouth daily before breakfast.     [provider]  losartan (COZAAR) 50 MG tablet Take 50 mg by mouth daily.    [provider]  Melatonin 5 MG TABS Take 5 mg by mouth at bedtime.    [provider]  Menthol, Topical Analgesic, (BIOFREEZE) 4 % GEL Apply 1 application topically 3 (three) times daily. To right shoulder for pain    [provider]  metFORMIN (GLUCOPHAGE-XR) 500 MG 24 hr tablet Take 500 mg by mouth daily with breakfast.    [provider]  metoprolol succinate (TOPROL-XL) 25 MG 24 hr tablet Take 25 mg by mouth daily.    [provider]  OXYGEN Inhale 2 L into the lungs continuous.    [provider]  pantoprazole (PROTONIX) 40 MG tablet Take 40 mg by mouth every other day.    [provider]  polyethylene glycol (MIRALAX / GLYCOLAX) packet Take 17 g by mouth daily as needed for mild constipation.     [provider]  potassium chloride SA (K-DUR) 20 MEQ tablet Take 20 mEq by mouth daily.    [provider]  predniSONE (DELTASONE) 5 MG tablet Label  & dispense according to the schedule below. Take 6 Pills PO for 3 days, 4 Pills PO for 3 days, 2 Pills PO for 3 days, 1 Pills PO for 3 days, 1/2 Pill  PO for 3 days then STOP. 04/02/19   Leroy SeaSingh, Prashant K, MD  saccharomyces boulardii (FLORASTOR) 250 MG capsule Take 250 mg by mouth at bedtime.    [provider]  sodium chloride (OCEAN) 0.65 % nasal spray Place 1 spray into the nose 3 (three) times daily.    [provider]  traZODone (DESYREL) 50 MG tablet Take 50 mg by mouth at bedtime.    [provider]  vitamin B-12 (CYANOCOBALAMIN) 1000 MCG tablet Take 500 mcg by mouth daily.     [provider]  Vitamin D, Ergocalciferol, (DRISDOL) 1.25 MG (50000 UT) CAPS capsule Take 50,000 Units by mouth every 30 (thirty) days.    [provider]   zinc oxide (BALMEX) 11.3 % CREA cream Apply 1 application topically as needed (to buttocks).     [provider]    Family History Family History  Problem Relation Age of Onset   Diabetes Mother    Hypertension Father    Emphysema Father     Social History Social History   Tobacco Use   Smoking status: Never Smoker   Smokeless tobacco: Never Used  Substance Use Topics   Alcohol use: No    Alcohol/week: 0.0 standard drinks   Drug use: No     Allergies   Bee venom   Review of Systems Review of Systems  Unable to perform ROS: Acuity of condition     Physical Exam Updated Vital Signs BP (!) 86/56 (  BP Location: Left Arm)    Pulse 99    Temp (!) 95.9 F (35.5 C) (Axillary)    Ht 5' (1.524 m)    Wt 93.3 kg    BMI 40.17 kg/m   Physical Exam Constitutional:      General: She is in acute distress.     Appearance: She is obese. She is toxic-appearing.  HENT:     Head: Normocephalic.     Nose: Nose normal.     Mouth/Throat:     Mouth: Mucous membranes are dry.  Eyes:     Comments: 3 mm bilaterally and fixed and non-reactive  Neck:     Musculoskeletal: Neck supple.  Cardiovascular:     Rate and Rhythm: Tachycardia present.     Pulses: Normal pulses.  Pulmonary:     Breath sounds: Rhonchi and rales present.     Comments: King airway in place, coarse breath sounds and diminished throughout Abdominal:     General: There is no distension.  Musculoskeletal:        General: No swelling.  Skin:    General: Skin is warm.     Findings: No rash.  Neurological:     GCS: GCS eye subscore is 1. GCS verbal subscore is 1. GCS motor subscore is 1.      ED Treatments / Results  Labs (all labs ordered are listed, but only abnormal results are displayed) Labs Reviewed  COMPREHENSIVE METABOLIC PANEL - Abnormal; Notable for the following components:      Result Value   Chloride 80 (*)    CO2 41 (*)    Glucose, Bld 300 (*)    Total Protein 5.6 (*)     Albumin 2.7 (*)    AST 209 (*)    Anion gap 19 (*)    All other components within normal limits  LACTIC ACID, PLASMA - Abnormal; Notable for the following components:   Lactic Acid, Venous 10.5 (*)    All other components within normal limits  CBC WITH DIFFERENTIAL/PLATELET - Abnormal; Notable for the following components:   WBC 13.5 (*)    RBC 3.78 (*)    Hemoglobin 11.0 (*)    MCV 102.6 (*)    MCHC 28.4 (*)    nRBC 0.5 (*)    Neutro Abs 8.4 (*)    Lymphs Abs 4.6 (*)    All other components within normal limits  TROPONIN I (HIGH SENSITIVITY) - Abnormal; Notable for the following components:   Troponin I (High Sensitivity) 37 (*)    All other components within normal limits  SARS CORONAVIRUS 2 (HOSPITAL ORDER, [facility])  LIPASE, BLOOD  PROTIME-INR  D-DIMER, QUANTITATIVE (NOT AT [facility])    EKG EKG Interpretation  Date/Time:  Saturday June 01 2019 08:27:55 EDT Ventricular Rate:  97 PR Interval:    QRS Duration: 127 QT Interval:  395 QTC Calculation: 502 R Axis:   62 Text Interpretation:  Sinus rhythm Right bundle branch block Confirmed by Lennice Sites [phone]) on 06/12/2019 8:49:51 AM   Radiology Dg Chest Portable 1 View  Result Date: 06/06/2019 CLINICAL DATA:  Encounter for endotracheal tube placement. EXAM: PORTABLE CHEST 1 VIEW COMPARISON:  03/27/2019 FINDINGS: The ET tube tip is above the carina by approximately 2.3 cm. There is a nasogastric tube with tip below GE junction. Asymmetric elevation of right hemidiaphragm. Similar appearance of bilateral pleural effusions. Persistent and progressive bilateral pulmonary opacities are identified with new worsening aeration to the right  upper lobe. IMPRESSION: 1. Satisfactory position of ET tube with tip above carina. 2. Persistent and progressive bilateral airspace opacities and pleural effusions. Electronically Signed   By: Signa Kell M.D.   On: 06/23/2019 09:11    Procedures .Critical  Care Performed by: Virgina Norfolk, DO Authorized by: Virgina Norfolk, DO   Critical care provider statement:    Critical care time (minutes):  65   Critical care was necessary to treat or prevent imminent or life-threatening deterioration of the following conditions:  Cardiac failure, sepsis and respiratory failure   Critical care was time spent personally by me on the following activities:  Blood draw for specimens, development of treatment plan with patient or surrogate, evaluation of patient's response to treatment, examination of patient, obtaining history from patient or surrogate, ordering and performing treatments and interventions, ordering and review of laboratory studies, ordering and review of radiographic studies, pulse oximetry, re-evaluation of patient's condition and review of old charts   I assumed direction of critical care for this patient from another provider in my specialty: no   Procedure Name: Intubation Date/Time: 23-Jun-2019 9:01 AM Performed by: Virgina Norfolk, DO Pre-anesthesia Checklist: Patient identified, Timeout performed, Suction available and Patient being monitored Oxygen Delivery Method: Ambu bag Preoxygenation: Pre-oxygenation with 100% oxygen Ventilation: Mask ventilation without difficulty Laryngoscope Size: Glidescope and 4 Grade View: Grade I Tube size: 7.5 mm Number of attempts: 1 Airway Equipment and Method: Video-laryngoscopy and Rigid stylet Placement Confirmation: ETT inserted through vocal cords under direct vision Secured at: 23 cm Tube secured with: ETT holder Dental Injury: Teeth and Oropharynx as per pre-operative assessment  Difficulty Due To: Difficulty was unanticipated      (including critical care time)  Medications Ordered in ED Medications  dextrose 5 % solution (has no administration in time range)  acetaminophen (TYLENOL) tablet 650 mg (has no administration in time range)    Or  acetaminophen (TYLENOL) suppository 650 mg  (has no administration in time range)  diphenhydrAMINE (BENADRYL) injection 25 mg (has no administration in time range)  glycopyrrolate (ROBINUL) tablet 1 mg (has no administration in time range)    Or  glycopyrrolate (ROBINUL) injection 0.2 mg (has no administration in time range)    Or  glycopyrrolate (ROBINUL) injection 0.2 mg (has no administration in time range)  polyvinyl alcohol (LIQUIFILM TEARS) 1.4 % ophthalmic solution 1 drop (has no administration in time range)  morphine 100mg  in NS (1mg /mL) infusion - premix (has no administration in time range)     Initial Impression / Assessment and Plan / ED Course  I have reviewed the triage vital signs and the nursing notes.  Pertinent labs & imaging results that were available during my care of the patient were reviewed by me and considered in my medical decision making (see chart for details).     Jocee P Grosshans is a 58 year old female with diabetes, intellectual disability, heart failure who presents to the ED after cardiac arrest.  Patient had been at nursing facility following recent coronavirus infection.  Patient with unknown downtime but found by nursing staff unresponsive and without pulses.  Last seen about an hour prior to that event.  Patient was hypoxic.  King airway was placed by EMS.  Return of spontaneous circulation was achieved after 4 rounds of epinephrine.  Patient did not have any shockable rhythms.  She was intubated upon arrival.  Oxygenation slowly improved.  She was given several doses of IV phenylephrine, normal saline bolus, started on Levophed with  improvement of hemodynamics.  However, patient with a GCS of 3 upon arrival.  Pupils are fixed.  Contacted power of attorney who wanted to transition patient to comfort care.  Patient was a full code however family had been in the works of trying to make her a DNR.  Chest x-ray showed bilateral opacities, edema.  Lactic acid was 10.  Patient was initially hypotensive  and hypothermic.  Likely respiratory failure in the setting of infectious process or embolism.  Bedside ultrasound showed fairly good cardiac function without effusion.  Prior to starting heparin/antibiotics patient was transition to comfort care.  IV morphine was initiated.  Patient was pronounced expired at 9:18 AM.  Primary care physician contacted for death certificate.  Will touch base with medical examiner but expect this not to be an ME case.  Medical examiner states no need for immediate exam.  Coronavirus test was negative.  Suspect patient likely with hypoxic respiratory arrest in the setting of infection versus pulmonary embolism.  Possibly the sequelae of coronavirus infection recently.  Family was notified and was able to come to the emergency department to see the patient.  Final Clinical Impressions(s) / ED Diagnoses   Final diagnoses:  Respiratory distress  Cardiac arrest [facility](HCC)    ED Discharge Orders    None       Virgina NorfolkCuratolo, Kanya Potteiger, DO 02/18/19 [phone]

## 2019-06-18 NOTE — ED Notes (Signed)
Pt pupils are fixed-- skin pale/cold to touch.

## 2019-06-18 NOTE — Procedures (Signed)
Extubation Procedure Note  Patient Details:   Name: JOELEE SNOKE DOB: September 26, 1961 MRN: [id number]   Airway Documentation:  Airway 7.5 mm (Active)  Secured at (cm) 24 cm 06/10/2019 0000   Vent end date: (not recorded) Vent end time: (not recorded)   Evaluation  O2 sats:  Complications: Complications of comfort measure extubation RT extubated patient to room air per per comfort measure extubation orders      Dimple Nanas 05/23/2019, 9:20 AM

## 2019-06-18 NOTE — ED Notes (Signed)
Levophed stopped

## 2019-06-18 NOTE — Progress Notes (Signed)
ANTICOAGULATION CONSULT NOTE - Initial Consult  Pharmacy Consult for Heparin Indication: pulmonary embolus  Allergies  Allergen Reactions  . Bee Venom Other (See Comments)    On MAR    Patient Measurements: Height: 5' (152.4 cm) Weight: 205 lb 11 oz (93.3 kg) IBW/kg (Calculated) : 45.5 Heparin Dosing Weight: 70 kg  Vital Signs: Temp: 95.9 F (35.5 C) (08/15 0829) Temp Source: Axillary (08/15 0829) BP: 86/56 (08/15 0829) Pulse Rate: 99 (08/15 0829)  Labs: No results for input(s): HGB, HCT, PLT, APTT, LABPROT, INR, HEPARINUNFRC, HEPRLOWMOCWT, CREATININE, CKTOTAL, CKMB, TROPONINIHS in the last 72 hours.  CrCl cannot be calculated (Patient's most recent lab result is older than the maximum 21 days allowed.).   Medical History: Past Medical History:  Diagnosis Date  . Depressive disorder   . Diabetes mellitus without complication (Meghan Patterson)   . FTT (failure to thrive) in adult   . GERD (gastroesophageal reflux disease)   . Heart failure (Meghan Patterson)   . Hypercholesteremia   . Hypertension   . Hypothyroid   . Hypoxemia   . Intellectual disability   . Magnesium deficiency   . Rheumatoid arthritis (Meghan Patterson)     Medications:  No current facility-administered medications on file prior to encounter.    Current Outpatient Medications on File Prior to Encounter  Medication Sig Dispense Refill  . acetaminophen (TYLENOL) 325 MG tablet Take 650 mg by mouth every 8 (eight) hours as needed for fever or headache (or muscle pain).     Meghan Patterson albuterol (VENTOLIN HFA) 108 (90 Base) MCG/ACT inhaler Inhale 2 puffs into the lungs every 4 (four) hours as needed for wheezing or shortness of breath.    Meghan Patterson atorvastatin (LIPITOR) 80 MG tablet Take 80 mg by mouth at bedtime.    . citalopram (CELEXA) 10 MG tablet Take 10 mg by mouth daily.    . cycloSPORINE (RESTASIS) 0.05 % ophthalmic emulsion Place 1 drop into both eyes 2 (two) times daily.    Meghan Patterson dicyclomine (BENTYL) 20 MG tablet Take 20 mg by mouth 3 (three)  times daily before meals.    . fentaNYL (DURAGESIC) 12 MCG/HR Place 1 patch onto the skin every 3 (three) days. 2 patch 0  . Fluticasone-Salmeterol (ADVAIR) 250-50 MCG/DOSE AEPB Inhale 1 puff into the lungs 2 (two) times daily.    . furosemide (LASIX) 20 MG tablet Take 1 tablet (20 mg total) by mouth daily. 30 tablet 0  . insulin aspart (NOVOLOG) 100 UNIT/ML injection Before each meal 3 times a day , 140-199 - 2 units, 200-250 - 4 units, 251-299 - 6 units,  300-349 - 8 units,  350 or above 10 units. Insulin PEN if approved, provide syringes and needles if needed. Can switch to any other cheaper alternative. 10 mL 0  . ipratropium-albuterol (DUONEB) 0.5-2.5 (3) MG/3ML SOLN Take 3 mLs by nebulization every 6 (six) hours as needed (for wheezing).     Meghan Patterson levothyroxine (SYNTHROID, LEVOTHROID) 150 MCG tablet Take 150 mcg by mouth daily before breakfast.     . losartan (COZAAR) 50 MG tablet Take 50 mg by mouth daily.    . Melatonin 5 MG TABS Take 5 mg by mouth at bedtime.    . Menthol, Topical Analgesic, (BIOFREEZE) 4 % GEL Apply 1 application topically 3 (three) times daily. To right shoulder for pain    . metFORMIN (GLUCOPHAGE-XR) 500 MG 24 hr tablet Take 500 mg by mouth daily with breakfast.    . metoprolol succinate (TOPROL-XL) 25 MG 24 hr tablet  Take 25 mg by mouth daily.    . OXYGEN Inhale 2 L into the lungs continuous.    . pantoprazole (PROTONIX) 40 MG tablet Take 40 mg by mouth every other day.    . polyethylene glycol (MIRALAX / GLYCOLAX) packet Take 17 g by mouth daily as needed for mild constipation.     . potassium chloride SA (K-DUR) 20 MEQ tablet Take 20 mEq by mouth daily.    . predniSONE (DELTASONE) 5 MG tablet Label  & dispense according to the schedule below. Take 6 Pills PO for 3 days, 4 Pills PO for 3 days, 2 Pills PO for 3 days, 1 Pills PO for 3 days, 1/2 Pill  PO for 3 days then STOP. 50 tablet 0  . saccharomyces boulardii (FLORASTOR) 250 MG capsule Take 250 mg by mouth at bedtime.     . sodium chloride (OCEAN) 0.65 % nasal spray Place 1 spray into the nose 3 (three) times daily.    . traZODone (DESYREL) 50 MG tablet Take 50 mg by mouth at bedtime.    . vitamin B-12 (CYANOCOBALAMIN) 1000 MCG tablet Take 500 mcg by mouth daily.     . Vitamin D, Ergocalciferol, (DRISDOL) 1.25 MG (50000 UT) CAPS capsule Take 50,000 Units by mouth every 30 (thirty) days.    Meghan Patterson zinc oxide (BALMEX) 11.3 % CREA cream Apply 1 application topically as needed (to buttocks).        Assessment: 58 y.o. female admitted s/p cardiac arrest, possible PE, for heparin  Goal of Therapy:  Heparin level 0.3-0.7 units/ml Monitor platelets by anticoagulation protocol: Yes   Plan:  Heparin 4000 units IV bolus, then start heparin 1250 units/hr Check heparin level in 6 hours.   Meghan Patterson 05/31/2019,8:36 AM

## 2019-06-18 NOTE — ED Notes (Addendum)
Pt extubated per order from Dr. Ronnald Nian by Resp Therapy

## 2019-06-18 DEATH — deceased

## 2020-04-03 IMAGING — CT CT ABD-PELV W/ CM
1 of 3 series · 12 of 32 positions shown, 18 images · IV contrast (APPLIED)
Comparison: CT lumbar spine 10/31/2013; report from CT abdomen
dated 10/13/2009

CLINICAL DATA: Epigastric abdominal pain for 1 year. Blood in the
stool.

EXAM:
CT ABDOMEN AND PELVIS WITH CONTRAST
TECHNIQUE: Multidetector CT imaging of the abdomen and pelvis was performed
using the standard protocol following bolus administration of
intravenous contrast.
CONTRAST:  125mL LFEAIW-I33 IOPAMIDOL (LFEAIW-I33) INJECTION 61%
Creatinine was obtained on site at [HOSPITAL] at [HOSPITAL].
Results: Creatinine 0.7 mg/dL.

[Series 2: abd/pelvis w/cm · axial · 0.95mm/px · z∈[-338,+47]mm · 12 of 91 slices shown, 18 images]
[im 7/91  soft-tissue]
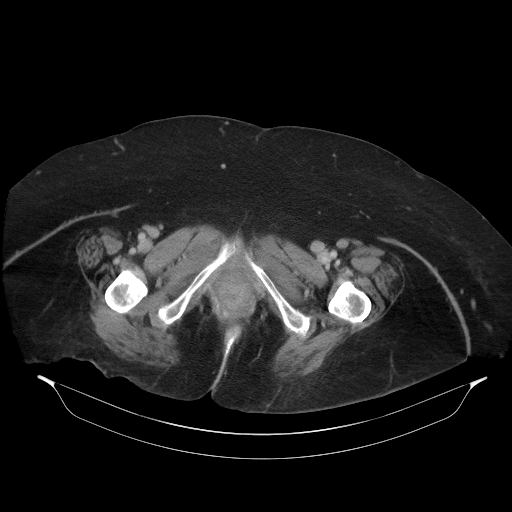
[im 7/91  bone]
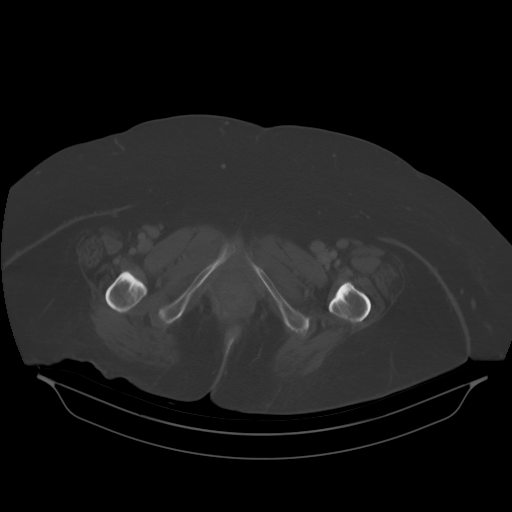
[im 14/91  soft-tissue]
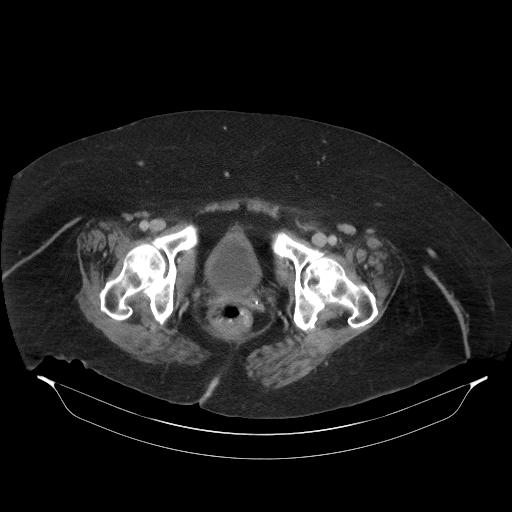
[im 21/91  soft-tissue]
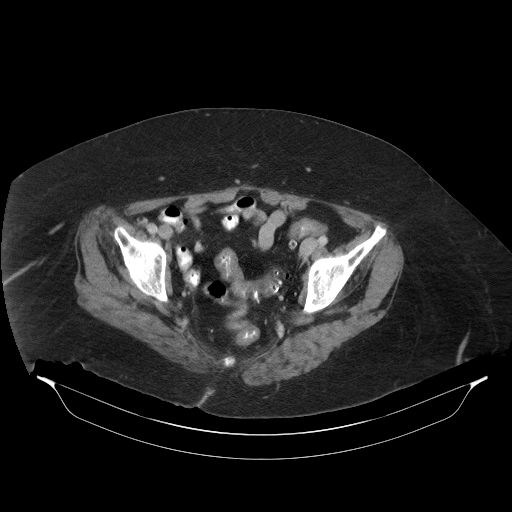
[im 28/91  soft-tissue]
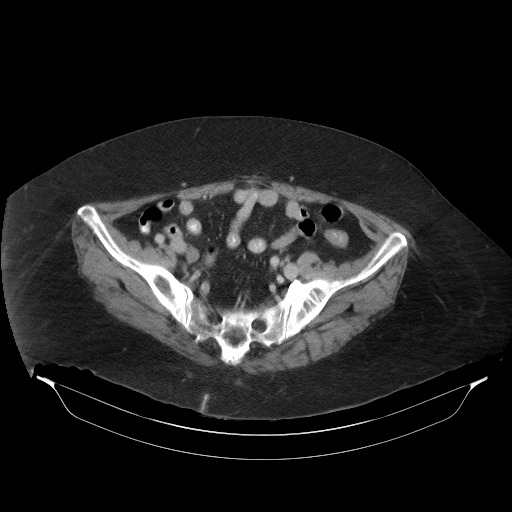
[im 35/91  soft-tissue]
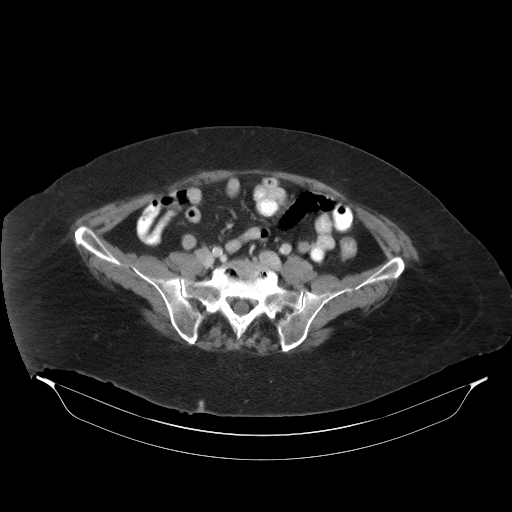
[im 42/91  soft-tissue]
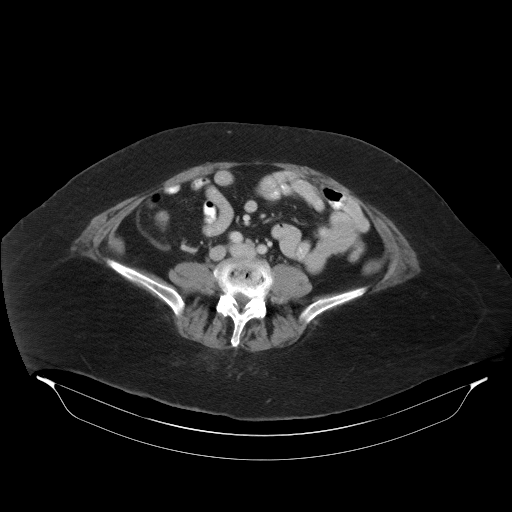
[im 49/91  soft-tissue]
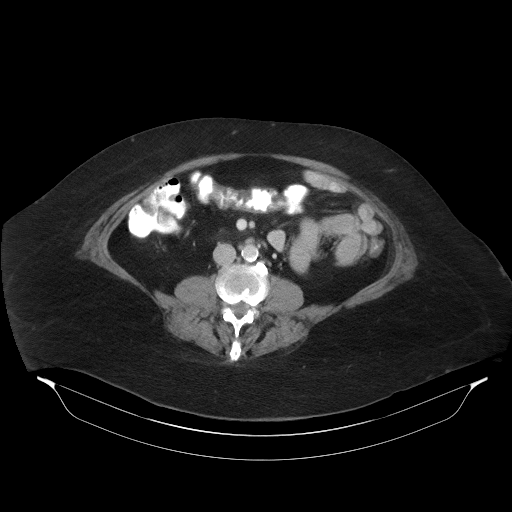
[im 56/91  soft-tissue]
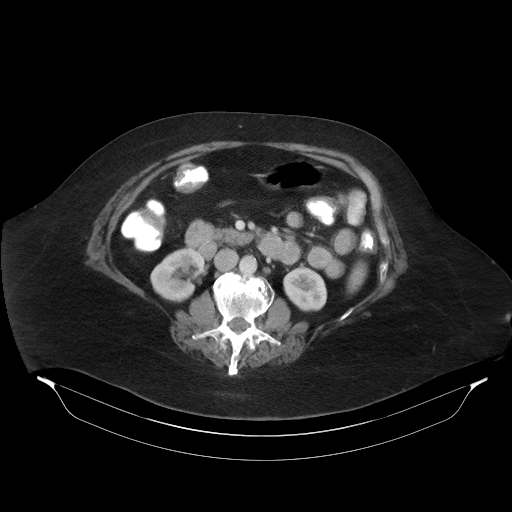
[im 63/91  soft-tissue]
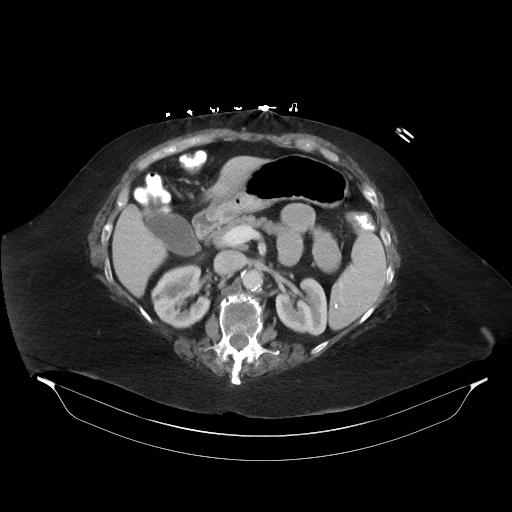
[im 63/91  lung]
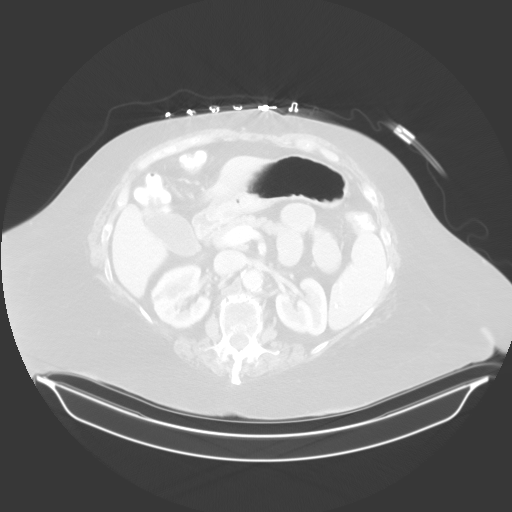
[im 63/91  bone]
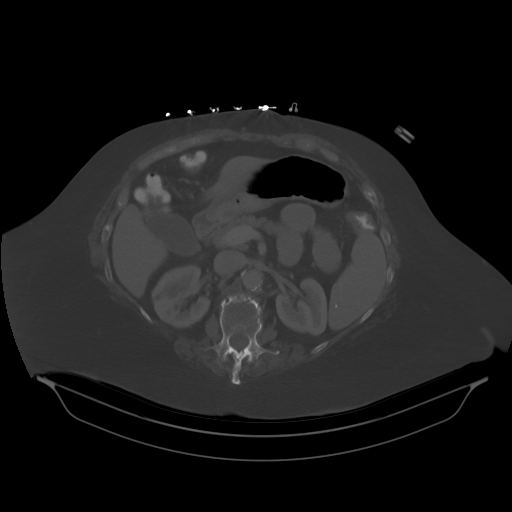
[im 70/91  soft-tissue]
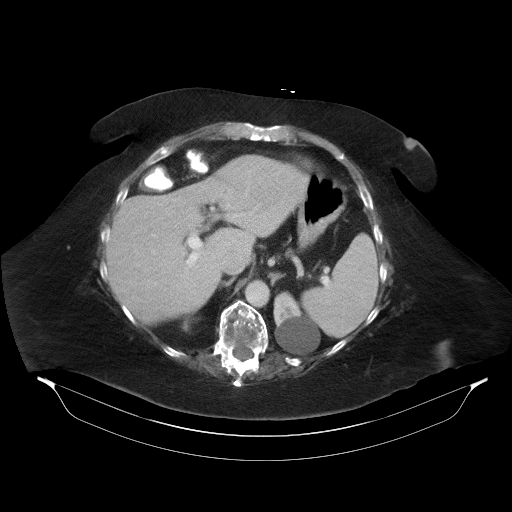
[im 70/91  lung]
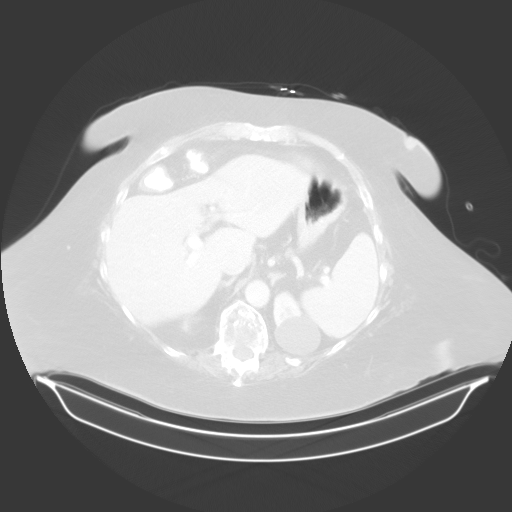
[im 77/91  soft-tissue]
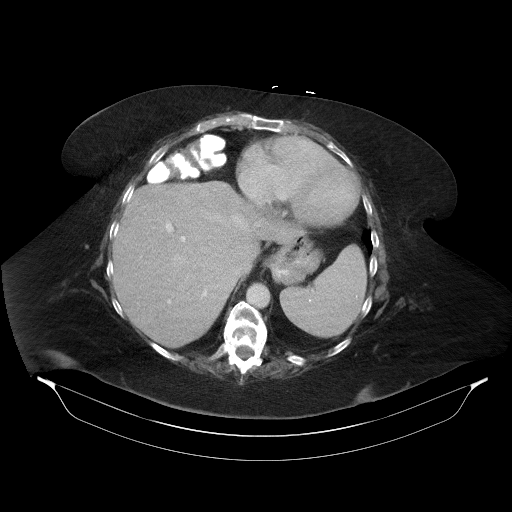
[im 77/91  lung]
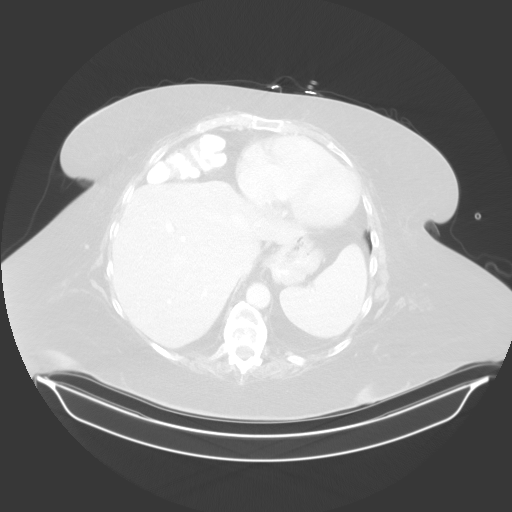
[im 84/91  soft-tissue]
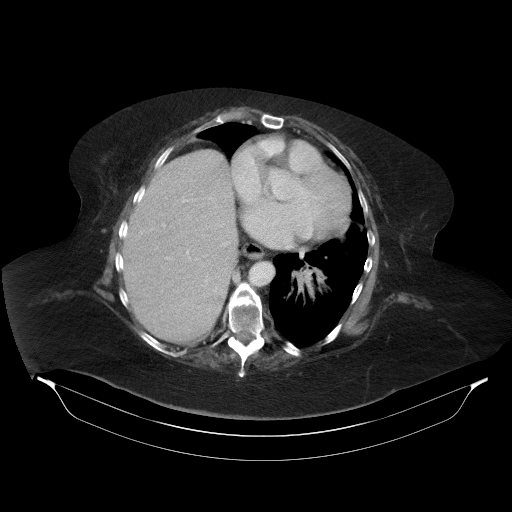
[im 84/91  lung]
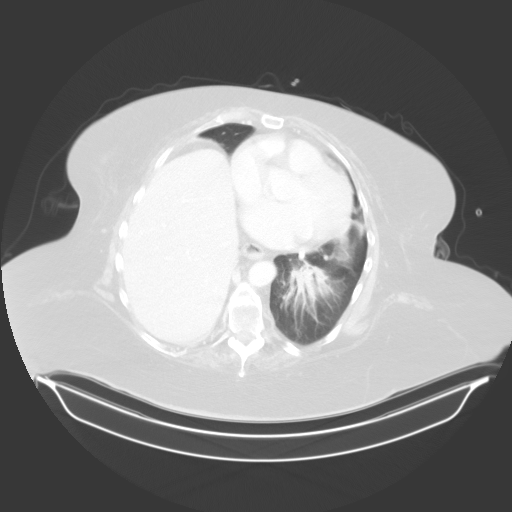

[12 of 32 positions shown; findings below may reference images not displayed]

FINDINGS: Lower chest: Right lower lobe atelectasis along the elevated right
hemidiaphragm. Mild left lower lobe atelectasis and mild lingular
atelectasis. Left anterior descending coronary atherosclerosis. Mild
cardiomegaly.

Hepatobiliary: No liver lesion identified.. Gallbladder appears
normal.

Pancreas: Unremarkable

Spleen: Punctate calcifications compatible with old granulomatous
disease. 6 mm hypodensity centrally in the spleen is technically
nonspecific but statistically likely to be benign.

Adrenals/Urinary Tract: The adrenal glands appear normal. Bosniak
category 1 left kidney upper pole cyst appears benign. Several small
cysts of the right kidney are technically too small to characterize
although statistically likely to be benign.

1-2 mm right mid kidney nonobstructive renal calculus on image 74/4.

3 mm nonobstructive left upper kidney calculus on image 79/9.

Stomach/Bowel: Mild sigmoid colon diverticulosis with some wall
thickening in the descending and sigmoid colon which is essentially
mostly from diverticulosis and under distention. No focal wall
thickening in the sigmoid colon. Mild anal incontinence of contrast
medium

Vascular/Lymphatic: Aortoiliac atherosclerotic vascular disease.
Small retroperitoneal lymph nodes are not pathologically enlarged
and may be reactive.

Reproductive: Uterus absent.  Adnexa unremarkable.

Other: No supplemental non-categorized findings.

Musculoskeletal: Lower thoracic kyphosis and spondylosis.
IMPRESSION: 1. Mild sigmoid colon diverticulosis. Wall thickening in the
descending and sigmoid colon is probably mostly from nondistention.
No focal colon mass is identified. Correlation with the patient's
colon cancer screening history is recommended. If screening is not
up-to-date, appropriate screening should be considered.
2. Elevated right hemidiaphragm, with some bibasilar atelectasis.
3. Aortic Atherosclerosis ([id number]). Coronary atherosclerosis
with mild cardiomegaly.
4. Old granulomatous disease.
5. Several nonspecific but probably benign right renal hypodense
lesions and a similar small hypodense lesion centrally in the
spleen.
6. Nonobstructive nephrolithiasis.
7. Lower thoracic kyphosis and spondylosis.

## 2021-03-30 IMAGING — DX PORTABLE CHEST - 1 VIEW
1 series · 1 of 1 positions shown · non-contrast
Comparison: 03/27/2019

CLINICAL DATA: Encounter for endotracheal tube placement.

EXAM:
PORTABLE CHEST 1 VIEW

[chest ap]
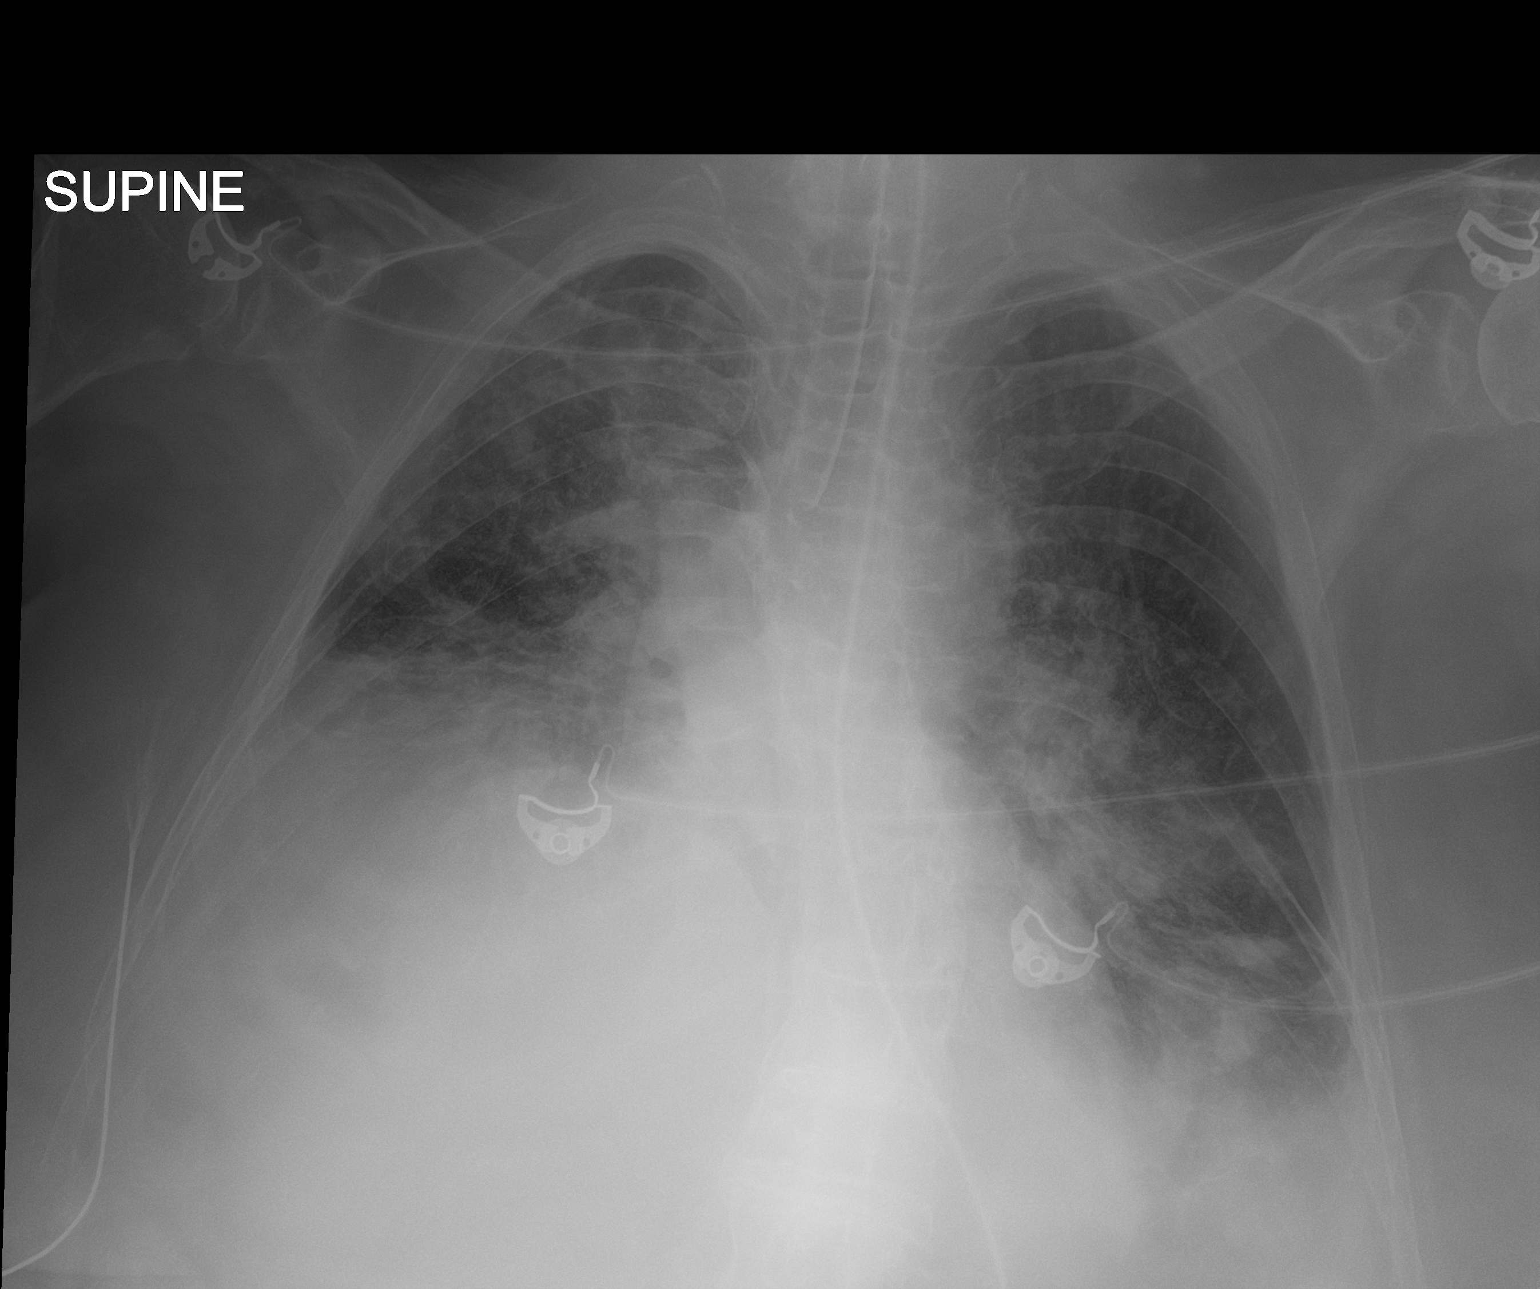

[1 of 1 positions shown; findings below may reference images not displayed]

FINDINGS: The ET tube tip is above the carina by approximately 2.3 cm. There
is a nasogastric tube with tip below GE junction. Asymmetric
elevation of right hemidiaphragm. Similar appearance of bilateral
pleural effusions. Persistent and progressive bilateral pulmonary
opacities are identified with new worsening aeration to the right
upper lobe.
IMPRESSION: 1. Satisfactory position of ET tube with tip above carina.
2. Persistent and progressive bilateral airspace opacities and
pleural effusions.
